# Patient Record
Sex: Female | Born: 1980 | Race: White | Hispanic: No | Marital: Married | Smoking: Never smoker
Health system: Southern US, Community
[De-identification: ages and names within clinical notes are randomized; demographics above are authoritative.]

## PROBLEM LIST (undated history)

## (undated) ENCOUNTER — Inpatient Hospital Stay (HOSPITAL_COMMUNITY): Payer: Self-pay

## (undated) DIAGNOSIS — F988 Other specified behavioral and emotional disorders with onset usually occurring in childhood and adolescence: Secondary | ICD-10-CM

## (undated) DIAGNOSIS — Z789 Other specified health status: Secondary | ICD-10-CM

## (undated) HISTORY — DX: Other specified behavioral and emotional disorders with onset usually occurring in childhood and adolescence: F98.8

## (undated) HISTORY — PX: OTHER SURGICAL HISTORY: SHX169

---

## 2001-04-22 ENCOUNTER — Other Ambulatory Visit: Admission: RE | Admit: 2001-04-22 | Discharge: 2001-04-22 | Payer: Self-pay | Admitting: *Deleted

## 2002-06-10 ENCOUNTER — Other Ambulatory Visit: Admission: RE | Admit: 2002-06-10 | Discharge: 2002-06-10 | Payer: Self-pay | Admitting: Obstetrics and Gynecology

## 2003-06-15 ENCOUNTER — Other Ambulatory Visit: Admission: RE | Admit: 2003-06-15 | Discharge: 2003-06-15 | Payer: Self-pay | Admitting: Obstetrics and Gynecology

## 2005-04-12 ENCOUNTER — Other Ambulatory Visit: Admission: RE | Admit: 2005-04-12 | Discharge: 2005-04-12 | Payer: Self-pay | Admitting: Obstetrics and Gynecology

## 2005-09-24 ENCOUNTER — Ambulatory Visit: Payer: Self-pay | Admitting: Internal Medicine

## 2006-04-19 ENCOUNTER — Other Ambulatory Visit: Admission: RE | Admit: 2006-04-19 | Discharge: 2006-04-19 | Payer: Self-pay | Admitting: Obstetrics & Gynecology

## 2006-04-19 ENCOUNTER — Ambulatory Visit: Payer: Self-pay | Admitting: Internal Medicine

## 2006-11-29 ENCOUNTER — Ambulatory Visit: Payer: Self-pay | Admitting: Internal Medicine

## 2006-11-29 LAB — CONVERTED CEMR LAB
ALT: 19 units/L (ref 0–40)
AST: 24 units/L (ref 0–37)
Albumin: 4.2 g/dL (ref 3.5–5.2)
Basophils Absolute: 0.1 10*3/uL (ref 0.0–0.1)
Calcium: 9.2 mg/dL (ref 8.4–10.5)
Chloride: 104 meq/L (ref 96–112)
Creatinine, Ser: 0.5 mg/dL (ref 0.4–1.2)
Eosinophils Absolute: 0.1 10*3/uL (ref 0.0–0.6)
Eosinophils Relative: 1.3 % (ref 0.0–5.0)
GFR calc non Af Amer: 157 mL/min
Glucose, Bld: 122 mg/dL — ABNORMAL HIGH (ref 70–99)
Ketones, ur: NEGATIVE mg/dL
LDL Cholesterol: 119 mg/dL — ABNORMAL HIGH (ref 0–99)
MCV: 94.8 fL (ref 78.0–100.0)
Nitrite: NEGATIVE
Platelets: 253 10*3/uL (ref 150–400)
RBC: 4.19 M/uL (ref 3.87–5.11)
RDW: 12 % (ref 11.5–14.6)
Total CHOL/HDL Ratio: 3.3
Total Protein, Urine: NEGATIVE mg/dL
Urine Glucose: NEGATIVE mg/dL
Urobilinogen, UA: 0.2 (ref 0.0–1.0)
VLDL: 10 mg/dL (ref 0–40)
WBC: 9.4 10*3/uL (ref 4.5–10.5)
pH: 6.5 (ref 5.0–8.0)

## 2007-08-12 ENCOUNTER — Encounter: Payer: Self-pay | Admitting: Internal Medicine

## 2007-08-12 DIAGNOSIS — F988 Other specified behavioral and emotional disorders with onset usually occurring in childhood and adolescence: Secondary | ICD-10-CM

## 2007-08-12 HISTORY — DX: Other specified behavioral and emotional disorders with onset usually occurring in childhood and adolescence: F98.8

## 2007-11-03 ENCOUNTER — Telehealth (INDEPENDENT_AMBULATORY_CARE_PROVIDER_SITE_OTHER): Payer: Self-pay | Admitting: *Deleted

## 2007-12-04 ENCOUNTER — Telehealth (INDEPENDENT_AMBULATORY_CARE_PROVIDER_SITE_OTHER): Payer: Self-pay | Admitting: *Deleted

## 2007-12-10 ENCOUNTER — Other Ambulatory Visit: Admission: RE | Admit: 2007-12-10 | Discharge: 2007-12-10 | Payer: Self-pay | Admitting: Obstetrics and Gynecology

## 2008-01-08 ENCOUNTER — Telehealth (INDEPENDENT_AMBULATORY_CARE_PROVIDER_SITE_OTHER): Payer: Self-pay | Admitting: *Deleted

## 2008-02-16 ENCOUNTER — Encounter: Payer: Self-pay | Admitting: Internal Medicine

## 2008-02-23 ENCOUNTER — Encounter: Payer: Self-pay | Admitting: Internal Medicine

## 2008-03-04 ENCOUNTER — Ambulatory Visit: Payer: Self-pay | Admitting: Internal Medicine

## 2008-03-04 DIAGNOSIS — K219 Gastro-esophageal reflux disease without esophagitis: Secondary | ICD-10-CM

## 2008-03-04 DIAGNOSIS — E739 Lactose intolerance, unspecified: Secondary | ICD-10-CM

## 2008-04-06 ENCOUNTER — Telehealth (INDEPENDENT_AMBULATORY_CARE_PROVIDER_SITE_OTHER): Payer: Self-pay | Admitting: *Deleted

## 2008-07-09 ENCOUNTER — Telehealth (INDEPENDENT_AMBULATORY_CARE_PROVIDER_SITE_OTHER): Payer: Self-pay | Admitting: *Deleted

## 2008-09-21 ENCOUNTER — Telehealth (INDEPENDENT_AMBULATORY_CARE_PROVIDER_SITE_OTHER): Payer: Self-pay | Admitting: *Deleted

## 2009-01-19 ENCOUNTER — Telehealth (INDEPENDENT_AMBULATORY_CARE_PROVIDER_SITE_OTHER): Payer: Self-pay | Admitting: *Deleted

## 2009-04-21 ENCOUNTER — Telehealth: Payer: Self-pay | Admitting: Internal Medicine

## 2009-05-19 ENCOUNTER — Ambulatory Visit: Payer: Self-pay | Admitting: Internal Medicine

## 2009-06-09 ENCOUNTER — Telehealth: Payer: Self-pay | Admitting: Internal Medicine

## 2009-08-24 ENCOUNTER — Telehealth: Payer: Self-pay | Admitting: Internal Medicine

## 2009-11-22 ENCOUNTER — Telehealth: Payer: Self-pay | Admitting: Internal Medicine

## 2010-02-22 ENCOUNTER — Telehealth: Payer: Self-pay | Admitting: Internal Medicine

## 2010-05-23 ENCOUNTER — Telehealth: Payer: Self-pay | Admitting: Internal Medicine

## 2010-05-29 ENCOUNTER — Ambulatory Visit: Payer: Self-pay | Admitting: Internal Medicine

## 2010-08-23 ENCOUNTER — Telehealth: Payer: Self-pay | Admitting: Internal Medicine

## 2010-09-26 LAB — HEPATITIS B SURFACE ANTIGEN: Hepatitis B Surface Ag: NEGATIVE

## 2010-09-26 LAB — ABO/RH: RH Type: POSITIVE

## 2010-09-26 LAB — HIV ANTIBODY (ROUTINE TESTING W REFLEX): HIV: NONREACTIVE

## 2010-09-26 LAB — RPR: RPR: NONREACTIVE

## 2010-10-19 ENCOUNTER — Telehealth: Payer: Self-pay | Admitting: Internal Medicine

## 2010-10-29 NOTE — L&D Delivery Note (Signed)
Requested by Dr. Renaldo Fiddler to  attend this C-section for arrest of descent.  Born to a 29y/o Primigravida with PNC A-Ab- and negative screens.  AROM 12 hours PTD with light MSAF.  Labor complicated by arrest of descent and  C-section performed.  Infant handed to Neo crying.  Dried, bulb suctioned and kept warm.  APGAR 9 and 9.  M. Heiress Williamson, MD Neonatologist

## 2010-11-28 NOTE — Progress Notes (Signed)
Summary: Vyvanse  Phone Note Call from Patient   Caller: Patient [phone] Summary of Call: Pt called requesting 3 mth refills of Vyvanse Initial call taken by: Margaret Pyle, CMA,  May 23, 2010 4:54 PM  Follow-up for Phone Call        no - was asked to make ROV at last refill Follow-up by: Corwin Levins MD,  May 23, 2010 5:05 PM  Additional Follow-up for Phone Call Additional follow up Details #1::        Pt informed via VM. Told to call back to sch appt. Additional Follow-up by: Margaret Pyle, CMA,  May 23, 2010 5:07 PM

## 2010-11-28 NOTE — Assessment & Plan Note (Signed)
Summary: f/u for medicine/o.v. not cpx per pt/cd   Vital Signs:  Patient profile:   30 year old female Height:      66.5 inches Weight:      124.75 pounds BMI:     19.91 O2 Sat:      95 % on Room air Temp:     98.3 degrees F oral Pulse rate:   70 / minute BP sitting:   106 / 68  (left arm) Cuff size:   regular  Vitals Entered By: Zella Ball Ewing CMA Duncan Dull) (May 29, 2010 11:31 AM)  O2 Flow:  Room air CC: Medication refill/RE   CC:  Medication refill/RE.  History of Present Illness: overall doing well,  good compliance and tolerance of med;  no overt signs of too high dosing such as wt loss or other.  No assoc psych symptoms such as increased panic or depressive symptom.  Functioning very well with her new positoin where she can work some from home, but also travels to 3 other cities in her work for a Corporate investment banker (Recruitment consultant).  No other complaints.  No fever, wt loss, night sweats, loss of appetite or other constitutional symptoms  Does plan on trying to become pregnant in the next yr and she know to stop the med when starting to try, or o/w becomes pregnant.   Problems Prior to Update: 1)  Gerd  (ICD-530.81) 2)  Glucose Intolerance  (ICD-271.3) 3)  Preventive Health Care  (ICD-V70.0) 4)  Add  (ICD-314.00)  Medications Prior to Update: 1)  Vyvanse 50 Mg  Caps (Lisdexamfetamine Dimesylate) .Marland Kitchen.. 1 By Mouth Once Daily - To Fill April 23, 2010   -    Please Make Return Office Visit For Further Refills After This  Current Medications (verified): 1)  Vyvanse 50 Mg  Caps (Lisdexamfetamine Dimesylate) .Marland Kitchen.. 1 By Mouth Once Daily - To Fill Sept 30, 2011  Allergies (verified): No Known Drug Allergies  Past History:  Past Medical History: Last updated: 03/04/2008 ADD GERD/regurgitation  Past Surgical History: Last updated: 03/04/2008 s/p IUD  Social History: Last updated: 05/29/2010 work - Occupational hygienist, Automotive engineer, also works for [employer]  -  Recruitment consultant Never Smoked Alcohol use-yes no children Married  Risk Factors: Smoking Status: never (03/04/2008)  Social History: Reviewed history from 03/04/2008 and no changes required. 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   Head:  normocephalic and atraumatic.   Eyes:  vision grossly intact, pupils equal, and pupils round.   Ears:  R ear normal and L ear normal.   Nose:  no external deformity and no nasal discharge.   Mouth:  no gingival abnormalities and pharynx pink and moist.   Neck:  supple and no masses.   Lungs:  normal respiratory effort and normal breath sounds.   Heart:  normal rate and regular rhythm.   Extremities:  no edema, no erythema  Neurologic:  alert & oriented X3, strength normal in all extremities, and gait normal.   Skin:  color normal and no rashes.   Psych:  good eye contact, not anxious appearing, and not depressed appearing.     Impression & Recommendations:  Problem # 1:  ADD (ICD-314.00) stable - to cont same meds -  refills done today  Complete Medication List: 1)  Vyvanse 50 Mg Caps (Lisdexamfetamine dimesylate) .Marland Kitchen.. 1 by mouth once daily - to fill sept 30, 2011  Patient Instructions: 1)  Continue all previous medications as before this visit  2)  Please schedule a follow-up appointment in 1 year or sooner if needed Prescriptions: VYVANSE 50 MG  CAPS (LISDEXAMFETAMINE DIMESYLATE) 1 by mouth once daily - to fill sept 30, 2011  #30 x 0   Entered and Authorized by:   Corwin Levins MD   Signed by:   Corwin Levins MD on 05/29/2010   Method used:   Print then Give to Patient   RxID:   [id number] VYVANSE 50 MG  CAPS (LISDEXAMFETAMINE DIMESYLATE) 1 by mouth once daily - to fill Jun 28, 2010  #30 x 0   Entered and  Authorized by:   Corwin Levins MD   Signed by:   Corwin Levins MD on 05/29/2010   Method used:   Print then Give to Patient   RxID:   [id number] VYVANSE 50 MG  CAPS (LISDEXAMFETAMINE DIMESYLATE) 1 by mouth once daily - to fill May 29, 2010  #30 x 0   Entered and Authorized by:   Corwin Levins MD   Signed by:   Corwin Levins MD on 05/29/2010   Method used:   Print then Give to Patient   RxID:   [id number]

## 2010-11-28 NOTE — Progress Notes (Signed)
Summary: Vyvanse  Phone Note Call from Patient   Caller: Patient [phone] Summary of Call: pt called requesting refill of Vyvanse Initial call taken by: Margaret Pyle, CMA,  November 22, 2009 9:09 AM  Follow-up for Phone Call        done hardcopy to LIM side B - dahlia  - 3 mmo Follow-up by: Corwin Levins MD,  November 22, 2009 12:37 PM  Additional Follow-up for Phone Call Additional follow up Details #1::        pt informed, rx in cabinet for pt pick up Additional Follow-up by: Margaret Pyle, CMA,  November 22, 2009 12:46 PM    New/Updated Medications: VYVANSE 50 MG  CAPS (LISDEXAMFETAMINE DIMESYLATE) 1 by mouth once daily - to fill Nov 23, 2009 VYVANSE 50 MG  CAPS (LISDEXAMFETAMINE DIMESYLATE) 1 by mouth once daily - to fill Dec 23, 2009 VYVANSE 50 MG  CAPS (LISDEXAMFETAMINE DIMESYLATE) 1 by mouth once daily - to fill Jan 22, 2010 Prescriptions: VYVANSE 50 MG  CAPS (LISDEXAMFETAMINE DIMESYLATE) 1 by mouth once daily - to fill Jan 22, 2010  #30 x 0   Entered and Authorized by:   Corwin Levins MD   Signed by:   Corwin Levins MD on 11/22/2009   Method used:   Print then Give to Patient   RxID:   [id number] VYVANSE 50 MG  CAPS (LISDEXAMFETAMINE DIMESYLATE) 1 by mouth once daily - to fill Dec 23, 2009  #30 x 0   Entered and Authorized by:   Corwin Levins MD   Signed by:   Corwin Levins MD on 11/22/2009   Method used:   Print then Give to Patient   RxID:   [id number] VYVANSE 50 MG  CAPS (LISDEXAMFETAMINE DIMESYLATE) 1 by mouth once daily - to fill Nov 23, 2009  #30 x 0   Entered and Authorized by:   Corwin Levins MD   Signed by:   Corwin Levins MD on 11/22/2009   Method used:   Print then Give to Patient   RxID:   [id number]   Appended Document: Vyvanse Spoke with pharmacist from [facility] in [place] (Amy [phone]) who called to verify Rx for Vyvanse and last OV.

## 2010-11-28 NOTE — Progress Notes (Signed)
Summary: Vyvanse  Phone Note Call from Patient   Caller: Patient [phone] Summary of Call: Pt called requesting 3 mth refill of Vyvanse Initial call taken by: Margaret Pyle, CMA,  August 23, 2010 10:48 AM  Follow-up for Phone Call        done hardcopy to LIM side B - dahlia  Follow-up by: Corwin Levins MD,  August 23, 2010 6:35 PM  Additional Follow-up for Phone Call Additional follow up Details #1::        pt informed via VM, Rx in cabinet for pt pick up Additional Follow-up by: Margaret Pyle, CMA,  August 24, 2010 8:21 AM    New/Updated Medications: VYVANSE 50 MG  CAPS (LISDEXAMFETAMINE DIMESYLATE) 1 by mouth once daily - to fill Aug 27, 2010 VYVANSE 50 MG  CAPS (LISDEXAMFETAMINE DIMESYLATE) 1 by mouth once daily - to fill Sep 26, 2010 VYVANSE 50 MG  CAPS (LISDEXAMFETAMINE DIMESYLATE) 1 by mouth once daily - to fill Oct 26, 2010 Prescriptions: VYVANSE 50 MG  CAPS (LISDEXAMFETAMINE DIMESYLATE) 1 by mouth once daily - to fill Oct 26, 2010  #30 x 0   Entered and Authorized by:   Corwin Levins MD   Signed by:   Corwin Levins MD on 08/23/2010   Method used:   Print then Give to Patient   RxID:   [id number] VYVANSE 50 MG  CAPS (LISDEXAMFETAMINE DIMESYLATE) 1 by mouth once daily - to fill Sep 26, 2010  #30 x 0   Entered and Authorized by:   Corwin Levins MD   Signed by:   Corwin Levins MD on 08/23/2010   Method used:   Print then Give to Patient   RxID:   [id number] VYVANSE 50 MG  CAPS (LISDEXAMFETAMINE DIMESYLATE) 1 by mouth once daily - to fill Aug 27, 2010  #30 x 0   Entered and Authorized by:   Corwin Levins MD   Signed by:   Corwin Levins MD on 08/23/2010   Method used:   Print then Give to Patient   RxID:   [id number]

## 2010-11-28 NOTE — Progress Notes (Signed)
Summary: Sydney Stewart  Phone Note Call from Patient   Caller: Patient [phone] Summary of Call: Pt called requesting Rx for Sydney Stewart Initial call taken by: Margaret Pyle, CMA,  February 22, 2010 11:13 AM  Follow-up for Phone Call        done hardcopy to LIM side B - dahlia  Follow-up by: Corwin Levins MD,  February 22, 2010 11:35 AM  Additional Follow-up for Phone Call Additional follow up Details #1::        pt informed via VM, Rx in cabinet for pick up Additional Follow-up by: Margaret Pyle, CMA,  February 22, 2010 11:39 AM    New/Updated Medications: Sydney Stewart 50 MG  CAPS (LISDEXAMFETAMINE DIMESYLATE) 1 by mouth once daily - to fill Feb 22, 2010 Sydney Stewart 50 MG  CAPS (LISDEXAMFETAMINE DIMESYLATE) 1 by mouth once daily - to fill Mar 24, 2010 Sydney Stewart 50 MG  CAPS (LISDEXAMFETAMINE DIMESYLATE) 1 by mouth once daily - to fill April 23, 2010 Sydney Stewart 50 MG  CAPS (LISDEXAMFETAMINE DIMESYLATE) 1 by mouth once daily - to fill April 23, 2010   -    please make return office visit for further refills after this Prescriptions: Sydney Stewart 50 MG  CAPS (LISDEXAMFETAMINE DIMESYLATE) 1 by mouth once daily - to fill April 23, 2010   -    please make return office visit for further refills after this  #30 x 0   Entered and Authorized by:   Corwin Levins MD   Signed by:   Corwin Levins MD on 02/22/2010   Method used:   Print then Give to Patient   RxID:   [id number] Sydney Stewart 50 MG  CAPS (LISDEXAMFETAMINE DIMESYLATE) 1 by mouth once daily - to fill April 23, 2010  #30 x 0   Entered and Authorized by:   Corwin Levins MD   Signed by:   Corwin Levins MD on 02/22/2010   Method used:   Print then Give to Patient   RxID:   [id number] Sydney Stewart 50 MG  CAPS (LISDEXAMFETAMINE DIMESYLATE) 1 by mouth once daily - to fill Mar 24, 2010  #30 x 0   Entered and Authorized by:   Corwin Levins MD   Signed by:   Corwin Levins MD on 02/22/2010   Method used:   Print then Give to Patient   RxID:    [id number] Sydney Stewart 50 MG  CAPS (LISDEXAMFETAMINE DIMESYLATE) 1 by mouth once daily - to fill Feb 22, 2010  #30 x 0   Entered and Authorized by:   Corwin Levins MD   Signed by:   Corwin Levins MD on 02/22/2010   Method used:   Print then Give to Patient   RxID:   [id number]

## 2010-11-30 NOTE — Progress Notes (Signed)
Summary: Vyvanse  Phone Note Call from Patient   Caller: Patient Summary of Call: Patient came in to pickup Vyvanse prescriptions written on 08/23/2010. Patient was called to pickup prescriptions, but never came by to pick them up. The cabnet was cleaned out of presciptions not picked up and had been there over a month.. Patient is requesting the refill on her Vyvanse. Initial call taken by: [name] Ewing CMA Duncan Dull),  October 19, 2010 11:03 AM  Follow-up for Phone Call        done hardcopy to LIM side B - dahlia \\par  Follow-up by: Corwin Levins MD,  October 19, 2010 11:12 AM  Additional Follow-up for Phone Call Additional follow up Details #1::        no answer no VM. Margaret Pyle, CMA  October 19, 2010 1:20 PM     Additional Follow-up for Phone Call Additional follow up Details #2::    called patient at [phone] left message that prescriptions requested are at the front desk and ready for pickup. Follow-up by: Zella Ball Ewing CMA Duncan Dull),  October 19, 2010 3:35 PM  New/Updated Medications: VYVANSE 50 MG  CAPS (LISDEXAMFETAMINE DIMESYLATE) 1 by mouth once daily - to fill Oct 19, 2010 VYVANSE 50 MG  CAPS (LISDEXAMFETAMINE DIMESYLATE) 1 by mouth once daily - to fill Nov 18, 2010 VYVANSE 50 MG  CAPS (LISDEXAMFETAMINE DIMESYLATE) 1 by mouth once daily - to fill Dec 18, 2010 Prescriptions: VYVANSE 50 MG  CAPS (LISDEXAMFETAMINE DIMESYLATE) 1 by mouth once daily - to fill Dec 18, 2010  #30 x 0   Entered and Authorized by:   Corwin Levins MD   Signed by:   Corwin Levins MD on 10/19/2010   Method used:   Print then Give to Patient   RxID:   [id number] VYVANSE 50 MG  CAPS (LISDEXAMFETAMINE DIMESYLATE) 1 by mouth once daily - to fill Nov 18, 2010  #30 x 0   Entered and Authorized by:   Corwin Levins MD   Signed by:   Corwin Levins MD on 10/19/2010   Method used:   Print then Give to Patient   RxID:   [id number] VYVANSE 50 MG  CAPS (LISDEXAMFETAMINE DIMESYLATE) 1 by mouth once  daily - to fill Oct 19, 2010  #30 x 0   Entered and Authorized by:   Corwin Levins MD   Signed by:   Corwin Levins MD on 10/19/2010   Method used:   Print then Give to Patient   RxID:   [id number]

## 2011-03-23 ENCOUNTER — Ambulatory Visit: Payer: Self-pay | Admitting: Internal Medicine

## 2011-05-07 ENCOUNTER — Inpatient Hospital Stay (HOSPITAL_COMMUNITY)
Admission: AD | Admit: 2011-05-07 | Discharge: 2011-05-07 | Payer: Managed Care, Other (non HMO) | Source: Ambulatory Visit | Attending: Obstetrics and Gynecology | Admitting: Obstetrics and Gynecology

## 2011-05-07 ENCOUNTER — Inpatient Hospital Stay (HOSPITAL_COMMUNITY): Admission: AD | Admit: 2011-05-07 | Payer: Self-pay | Source: Home / Self Care | Admitting: Obstetrics and Gynecology

## 2011-05-07 ENCOUNTER — Encounter (HOSPITAL_COMMUNITY): Payer: Self-pay | Admitting: *Deleted

## 2011-05-07 ENCOUNTER — Inpatient Hospital Stay (HOSPITAL_COMMUNITY)
Admission: AD | Admit: 2011-05-07 | Discharge: 2011-05-11 | DRG: 766 | Disposition: A | Discharge status: Home / Self Care | Payer: Managed Care, Other (non HMO) | Source: Ambulatory Visit | Attending: Obstetrics and Gynecology | Admitting: Obstetrics and Gynecology

## 2011-05-07 LAB — CBC
MCHC: 35 g/dL (ref 30.0–36.0)
Platelets: 168 10*3/uL (ref 150–400)
RDW: 12.5 % (ref 11.5–15.5)

## 2011-05-07 MED ORDER — ACETAMINOPHEN 325 MG PO TABS: 650.0000 mg | ORAL_TABLET | ORAL | Status: DC | PRN

## 2011-05-07 MED ORDER — SODIUM CHLORIDE 0.9 % IV SOLN: 250.0000 mL | INTRAVENOUS | Status: DC

## 2011-05-07 MED ORDER — ZOLPIDEM TARTRATE 10 MG PO TABS: 10.0000 mg | ORAL_TABLET | Freq: Every evening | ORAL | Status: DC | PRN

## 2011-05-07 MED ORDER — LACTATED RINGERS IV SOLN
INTRAVENOUS | Status: DC
  Administered 2011-05-08: 500 mL/h via INTRAVENOUS
  Administered 2011-05-08: 21:00:00 via INTRAVENOUS
  Administered 2011-05-08: 125 mL via INTRAVENOUS
  Administered 2011-05-08: 125 mL/h via INTRAVENOUS

## 2011-05-07 MED ORDER — IBUPROFEN 600 MG PO TABS: 600.0000 mg | ORAL_TABLET | Freq: Four times a day (QID) | ORAL | Status: DC | PRN

## 2011-05-07 MED ORDER — ONDANSETRON HCL 4 MG/2ML IJ SOLN: 4.0000 mg | Freq: Four times a day (QID) | INTRAMUSCULAR | Status: DC | PRN

## 2011-05-07 MED ORDER — OXYTOCIN 20 UNITS IN LACTATED RINGERS INFUSION - SIMPLE
125.0000 mL/h | Freq: Once | INTRAVENOUS | Status: DC
  Filled 2011-05-07: qty 1000

## 2011-05-07 MED ORDER — SODIUM CHLORIDE 0.9 % IJ SOLN: 3.0000 mL | Freq: Two times a day (BID) | INTRAMUSCULAR | Status: DC

## 2011-05-07 MED ORDER — MISOPROSTOL 25 MCG QUARTER TABLET
25.0000 ug | ORAL_TABLET | ORAL | Status: DC
  Administered 2011-05-07: 25 ug via VAGINAL
  Filled 2011-05-07 (×4): qty 1
  Filled 2011-05-07: qty 0.25

## 2011-05-07 MED ORDER — LACTATED RINGERS IV SOLN: 500.0000 mL | Freq: Once | INTRAVENOUS | Status: AC | PRN

## 2011-05-07 MED ORDER — FLEET ENEMA 7-19 GM/118ML RE ENEM: 1.0000 | ENEMA | RECTAL | Status: DC | PRN

## 2011-05-07 MED ORDER — LIDOCAINE HCL (PF) 1 % IJ SOLN: 30.0000 mL | Freq: Once | INTRAMUSCULAR | Status: AC | PRN

## 2011-05-07 MED ORDER — CITRIC ACID-SODIUM CITRATE 334-500 MG/5ML PO SOLN
30.0000 mL | ORAL | Status: DC | PRN
  Administered 2011-05-08: 30 mL via ORAL
  Filled 2011-05-07: qty 30

## 2011-05-07 MED ORDER — SODIUM CHLORIDE 0.9 % IJ SOLN: 3.0000 mL | INTRAMUSCULAR | Status: DC | PRN

## 2011-05-07 NOTE — Plan of Care (Signed)
Problem: Consults Goal: Birthing Suites Patient Information Press F2 to bring up selections list  Outcome: Completed/Met Date Met:  05/07/11  Pt > [redacted] weeks EGA and Inpatient induction

## 2011-05-08 ENCOUNTER — Encounter (HOSPITAL_COMMUNITY): Payer: Self-pay

## 2011-05-08 ENCOUNTER — Encounter (HOSPITAL_COMMUNITY)
Admission: AD | Disposition: A | Discharge status: Home / Self Care | Payer: Self-pay | Source: Ambulatory Visit | Attending: Obstetrics and Gynecology

## 2011-05-08 ENCOUNTER — Encounter (HOSPITAL_COMMUNITY): Payer: Self-pay | Admitting: Anesthesiology

## 2011-05-08 ENCOUNTER — Inpatient Hospital Stay (HOSPITAL_COMMUNITY): Payer: Managed Care, Other (non HMO) | Admitting: Anesthesiology

## 2011-05-08 ENCOUNTER — Other Ambulatory Visit: Payer: Self-pay | Admitting: Obstetrics and Gynecology

## 2011-05-08 LAB — RPR: RPR Ser Ql: NONREACTIVE

## 2011-05-08 SURGERY — Surgical Case
Anesthesia: Regional

## 2011-05-08 SURGERY — Surgical Case
Anesthesia: Epidural | Site: Abdomen | Wound class: Clean Contaminated

## 2011-05-08 MED ORDER — METOCLOPRAMIDE HCL 5 MG/ML IJ SOLN: 10.0000 mg | Freq: Three times a day (TID) | INTRAMUSCULAR | Status: DC | PRN

## 2011-05-08 MED ORDER — SODIUM BICARBONATE 8.4 % IV SOLN
INTRAVENOUS | Status: AC
  Filled 2011-05-08: qty 50

## 2011-05-08 MED ORDER — SODIUM CHLORIDE 0.9 % IV SOLN: 1.0000 ug/kg/h | INTRAVENOUS | Status: DC | PRN

## 2011-05-08 MED ORDER — OXYTOCIN 10 UNIT/ML IJ SOLN
INTRAMUSCULAR | Status: AC
  Administered 2011-05-08: 20 [IU] via INTRAVENOUS
  Filled 2011-05-08: qty 2

## 2011-05-08 MED ORDER — PRENATAL PLUS 27-1 MG PO TABS
1.0000 | ORAL_TABLET | Freq: Every day | ORAL | Status: DC
  Administered 2011-05-10: 1 via ORAL
  Filled 2011-05-08 (×3): qty 1

## 2011-05-08 MED ORDER — EPHEDRINE 5 MG/ML INJ
10.0000 mg | INTRAVENOUS | Status: DC | PRN
  Filled 2011-05-08 (×2): qty 4

## 2011-05-08 MED ORDER — PHENYLEPHRINE HCL 10 MG/ML IJ SOLN
INTRAMUSCULAR | Status: DC | PRN
  Administered 2011-05-08: 160 ug via INTRAVENOUS

## 2011-05-08 MED ORDER — LIDOCAINE-EPINEPHRINE (PF) 2 %-1:200000 IJ SOLN
INTRAMUSCULAR | Status: DC | PRN
  Administered 2011-05-08: 40 mg via INTRADERMAL
  Administered 2011-05-08: 60 mg via INTRADERMAL

## 2011-05-08 MED ORDER — EPHEDRINE SULFATE 50 MG/ML IJ SOLN
INTRAMUSCULAR | Status: DC | PRN
  Administered 2011-05-08: 10 mg via INTRAVENOUS

## 2011-05-08 MED ORDER — EPHEDRINE 5 MG/ML INJ
INTRAVENOUS | Status: AC
  Filled 2011-05-08: qty 10

## 2011-05-08 MED ORDER — SENNOSIDES-DOCUSATE SODIUM 8.6-50 MG PO TABS
1.0000 | ORAL_TABLET | Freq: Every day | ORAL | Status: DC
  Administered 2011-05-09: 1 via ORAL

## 2011-05-08 MED ORDER — MEPERIDINE HCL 25 MG/ML IJ SOLN
INTRAMUSCULAR | Status: AC
  Filled 2011-05-08: qty 1

## 2011-05-08 MED ORDER — SCOPOLAMINE 1 MG/3DAYS TD PT72
MEDICATED_PATCH | TRANSDERMAL | Status: AC
  Administered 2011-05-08: 1.5 mg via TRANSDERMAL
  Filled 2011-05-08: qty 1

## 2011-05-08 MED ORDER — OXYTOCIN 20 UNITS IN LACTATED RINGERS INFUSION - SIMPLE: 125.0000 mL/h | INTRAVENOUS | Status: AC

## 2011-05-08 MED ORDER — ONDANSETRON HCL 4 MG/2ML IJ SOLN: 4.0000 mg | Freq: Three times a day (TID) | INTRAMUSCULAR | Status: DC | PRN

## 2011-05-08 MED ORDER — DEXTROSE IN LACTATED RINGERS 5 % IV SOLN: INTRAVENOUS | Status: DC

## 2011-05-08 MED ORDER — SIMETHICONE 80 MG PO CHEW
80.0000 mg | CHEWABLE_TABLET | ORAL | Status: DC | PRN
  Administered 2011-05-09: 80 mg via ORAL

## 2011-05-08 MED ORDER — OXYTOCIN 10 UNIT/ML IJ SOLN
INTRAMUSCULAR | Status: AC
  Filled 2011-05-08: qty 2

## 2011-05-08 MED ORDER — LISDEXAMFETAMINE DIMESYLATE 50 MG PO CAPS: 50.0000 mg | ORAL_CAPSULE | Freq: Every day | ORAL | Status: DC

## 2011-05-08 MED ORDER — LACTATED RINGERS IV SOLN: 500.0000 mL | Freq: Once | INTRAVENOUS | Status: DC

## 2011-05-08 MED ORDER — KETOROLAC TROMETHAMINE 60 MG/2ML IM SOLN
60.0000 mg | Freq: Once | INTRAMUSCULAR | Status: AC | PRN
  Filled 2011-05-08: qty 2

## 2011-05-08 MED ORDER — KETOROLAC TROMETHAMINE 30 MG/ML IJ SOLN: 30.0000 mg | Freq: Four times a day (QID) | INTRAMUSCULAR | Status: AC | PRN

## 2011-05-08 MED ORDER — ONDANSETRON HCL 4 MG/2ML IJ SOLN
INTRAMUSCULAR | Status: AC
  Filled 2011-05-08: qty 2

## 2011-05-08 MED ORDER — CEFAZOLIN SODIUM 1-5 GM-% IV SOLN
1.0000 g | Freq: Once | INTRAVENOUS | Status: AC
  Administered 2011-05-08: 1 g via INTRAVENOUS
  Filled 2011-05-08: qty 50

## 2011-05-08 MED ORDER — EPHEDRINE 5 MG/ML INJ
10.0000 mg | INTRAVENOUS | Status: DC | PRN
  Filled 2011-05-08: qty 4

## 2011-05-08 MED ORDER — PHENYLEPHRINE 40 MCG/ML (10ML) SYRINGE FOR IV PUSH (FOR BLOOD PRESSURE SUPPORT)
PREFILLED_SYRINGE | INTRAVENOUS | Status: AC
  Filled 2011-05-08: qty 5

## 2011-05-08 MED ORDER — SIMETHICONE 80 MG PO CHEW
80.0000 mg | CHEWABLE_TABLET | Freq: Three times a day (TID) | ORAL | Status: DC
  Administered 2011-05-09 – 2011-05-10 (×5): 80 mg via ORAL

## 2011-05-08 MED ORDER — OXYCODONE-ACETAMINOPHEN 5-325 MG PO TABS: 1.0000 | ORAL_TABLET | ORAL | Status: DC | PRN

## 2011-05-08 MED ORDER — MEASLES, MUMPS & RUBELLA VAC ~~LOC~~ INJ
0.5000 mL | INJECTION | Freq: Once | SUBCUTANEOUS | Status: DC
  Filled 2011-05-08: qty 0.5

## 2011-05-08 MED ORDER — DIPHENHYDRAMINE HCL 50 MG/ML IJ SOLN: 12.5000 mg | INTRAMUSCULAR | Status: DC | PRN

## 2011-05-08 MED ORDER — IBUPROFEN 600 MG PO TABS
600.0000 mg | ORAL_TABLET | Freq: Four times a day (QID) | ORAL | Status: DC
  Administered 2011-05-09 – 2011-05-11 (×8): 600 mg via ORAL
  Filled 2011-05-08 (×8): qty 1

## 2011-05-08 MED ORDER — MORPHINE SULFATE (PF) 0.5 MG/ML IJ SOLN
INTRAMUSCULAR | Status: DC | PRN
  Administered 2011-05-08: 4 mg via EPIDURAL

## 2011-05-08 MED ORDER — MEDROXYPROGESTERONE ACETATE 150 MG/ML IM SUSP: 150.0000 mg | INTRAMUSCULAR | Status: DC | PRN

## 2011-05-08 MED ORDER — OXYTOCIN 20 UNITS IN LACTATED RINGERS INFUSION - SIMPLE
1.0000 m[IU]/min | INTRAVENOUS | Status: DC
  Administered 2011-05-08: 2 m[IU]/min via INTRAVENOUS

## 2011-05-08 MED ORDER — FENTANYL 2.5 MCG/ML BUPIVACAINE 1/10 % EPIDURAL INFUSION (WH - ANES)
2.0000 mL/h | INTRAMUSCULAR | Status: DC
  Administered 2011-05-08 (×4): 14 mL/h via EPIDURAL
  Filled 2011-05-08 (×4): qty 60

## 2011-05-08 MED ORDER — ONDANSETRON HCL 4 MG/2ML IJ SOLN
INTRAMUSCULAR | Status: DC | PRN
  Administered 2011-05-08: 4 mg via INTRAMUSCULAR

## 2011-05-08 MED ORDER — DIPHENHYDRAMINE HCL 50 MG/ML IJ SOLN: 25.0000 mg | INTRAMUSCULAR | Status: DC | PRN

## 2011-05-08 MED ORDER — DIPHENHYDRAMINE HCL 25 MG PO CAPS: 25.0000 mg | ORAL_CAPSULE | Freq: Four times a day (QID) | ORAL | Status: DC | PRN

## 2011-05-08 MED ORDER — PHENYLEPHRINE 40 MCG/ML (10ML) SYRINGE FOR IV PUSH (FOR BLOOD PRESSURE SUPPORT)
80.0000 ug | PREFILLED_SYRINGE | INTRAVENOUS | Status: DC | PRN
  Filled 2011-05-08 (×2): qty 5

## 2011-05-08 MED ORDER — SCOPOLAMINE 1 MG/3DAYS TD PT72
1.0000 | MEDICATED_PATCH | Freq: Once | TRANSDERMAL | Status: DC
  Administered 2011-05-08: 1.5 mg via TRANSDERMAL

## 2011-05-08 MED ORDER — NALOXONE HCL 0.4 MG/ML IJ SOLN: 0.4000 mg | INTRAMUSCULAR | Status: DC | PRN

## 2011-05-08 MED ORDER — NALBUPHINE HCL 10 MG/ML IJ SOLN: 5.0000 mg | INTRAMUSCULAR | Status: AC | PRN

## 2011-05-08 MED ORDER — WITCH HAZEL-GLYCERIN EX PADS: MEDICATED_PAD | CUTANEOUS | Status: DC | PRN

## 2011-05-08 MED ORDER — PHENYLEPHRINE 40 MCG/ML (10ML) SYRINGE FOR IV PUSH (FOR BLOOD PRESSURE SUPPORT)
80.0000 ug | PREFILLED_SYRINGE | INTRAVENOUS | Status: DC | PRN
  Filled 2011-05-08: qty 5

## 2011-05-08 MED ORDER — RHO D IMMUNE GLOBULIN 1500 UNIT/2ML IJ SOLN: 300.0000 ug | Freq: Once | INTRAMUSCULAR | Status: DC

## 2011-05-08 MED ORDER — NALBUPHINE SYRINGE 5 MG/0.5 ML
INJECTION | INTRAMUSCULAR | Status: AC
  Filled 2011-05-08: qty 1

## 2011-05-08 MED ORDER — DIPHENHYDRAMINE HCL 25 MG PO CAPS: 25.0000 mg | ORAL_CAPSULE | ORAL | Status: DC | PRN

## 2011-05-08 MED ORDER — KETOROLAC TROMETHAMINE 60 MG/2ML IM SOLN
INTRAMUSCULAR | Status: AC
  Administered 2011-05-08: 60 mg via INTRAMUSCULAR
  Filled 2011-05-08: qty 2

## 2011-05-08 MED ORDER — OXYTOCIN 20 UNITS IN LACTATED RINGERS INFUSION - SIMPLE
INTRAVENOUS | Status: DC | PRN
  Administered 2011-05-08: 1000 mL/h via INTRAVENOUS

## 2011-05-08 MED ORDER — NALBUPHINE HCL 10 MG/ML IJ SOLN
10.0000 mg | INTRAMUSCULAR | Status: DC
  Administered 2011-05-08: 10 mg via INTRAVENOUS
  Filled 2011-05-08 (×2): qty 1

## 2011-05-08 MED ORDER — SODIUM BICARBONATE 8.4 % IV SOLN
INTRAVENOUS | Status: DC | PRN
  Administered 2011-05-08: 5 mL via EPIDURAL

## 2011-05-08 MED ORDER — MORPHINE SULFATE 10 MG/ML IJ SOLN
INTRAMUSCULAR | Status: DC | PRN
  Administered 2011-05-08: 1 mg via INTRAVENOUS

## 2011-05-08 MED ORDER — LIDOCAINE-EPINEPHRINE (PF) 2 %-1:200000 IJ SOLN
INTRAMUSCULAR | Status: AC
  Filled 2011-05-08: qty 20

## 2011-05-08 MED ORDER — MENTHOL 3 MG MT LOZG: 1.0000 | LOZENGE | OROMUCOSAL | Status: DC | PRN

## 2011-05-08 MED ORDER — FENTANYL CITRATE 0.05 MG/ML IJ SOLN: 25.0000 ug | INTRAMUSCULAR | Status: DC | PRN

## 2011-05-08 MED ORDER — ONDANSETRON HCL 4 MG/2ML IJ SOLN: 4.0000 mg | Freq: Once | INTRAMUSCULAR | Status: AC | PRN

## 2011-05-08 MED ORDER — DEXAMETHASONE SODIUM PHOSPHATE 10 MG/ML IJ SOLN
INTRAMUSCULAR | Status: AC
  Filled 2011-05-08: qty 1

## 2011-05-08 MED ORDER — TETANUS-DIPHTH-ACELL PERTUSSIS 5-2.5-18.5 LF-MCG/0.5 IM SUSP
0.5000 mL | Freq: Once | INTRAMUSCULAR | Status: AC
  Administered 2011-05-09: 0.5 mL via INTRAMUSCULAR
  Filled 2011-05-08: qty 0.5

## 2011-05-08 MED ORDER — ONDANSETRON HCL 4 MG PO TABS: 4.0000 mg | ORAL_TABLET | ORAL | Status: DC | PRN

## 2011-05-08 MED ORDER — MORPHINE SULFATE 0.5 MG/ML IJ SOLN
INTRAMUSCULAR | Status: AC
  Filled 2011-05-08: qty 10

## 2011-05-08 MED ORDER — MEPERIDINE HCL 25 MG/ML IJ SOLN
INTRAMUSCULAR | Status: DC | PRN
  Administered 2011-05-08 (×2): 6 mg via INTRAVENOUS
  Administered 2011-05-08: 7 mg via INTRAVENOUS
  Administered 2011-05-08: 6 mg via INTRAVENOUS

## 2011-05-08 SURGICAL SUPPLY — 30 items
ADH SKN CLS APL DERMABOND .7 (GAUZE/BANDAGES/DRESSINGS) ×1
CHLORAPREP W/TINT 26ML (MISCELLANEOUS) ×3 IMPLANT
CLOTH BEACON ORANGE TIMEOUT ST (SAFETY) ×2 IMPLANT
DERMABOND ADVANCED (GAUZE/BANDAGES/DRESSINGS) ×1
DERMABOND ADVANCED .7 DNX12 (GAUZE/BANDAGES/DRESSINGS) IMPLANT
DRAPE UTILITY XL STRL (DRAPES) ×2 IMPLANT
DRSG COVADERM 4X10 (GAUZE/BANDAGES/DRESSINGS) ×1 IMPLANT
ELECT REM PT RETURN 9FT ADLT (ELECTROSURGICAL) ×2
ELECTRODE REM PT RTRN 9FT ADLT (ELECTROSURGICAL) ×1 IMPLANT
EXTRACTOR VACUUM M CUP 4 TUBE (SUCTIONS) IMPLANT
GLOVE BIO SURGEON STRL SZ 6.5 (GLOVE) ×2 IMPLANT
GLOVE BIOGEL PI IND STRL 7.0 (GLOVE) ×2 IMPLANT
GLOVE BIOGEL PI INDICATOR 7.0 (GLOVE) ×2
GOWN BRE IMP SLV AUR LG STRL (GOWN DISPOSABLE) ×6 IMPLANT
KIT ABG SYR 3ML LUER SLIP (SYRINGE) ×2 IMPLANT
NDL HYPO 25X5/8 SAFETYGLIDE (NEEDLE) ×1 IMPLANT
NEEDLE HYPO 25X5/8 SAFETYGLIDE (NEEDLE) ×2 IMPLANT
NS IRRIG 1000ML POUR BTL (IV SOLUTION) ×2 IMPLANT
PACK C SECTION WH (CUSTOM PROCEDURE TRAY) ×2 IMPLANT
SLEEVE SCD COMPRESS KNEE MED (MISCELLANEOUS) ×1 IMPLANT
STAPLER VISISTAT 35W (STAPLE) ×1 IMPLANT
SUT CHROMIC 0 CT 802H (SUTURE) IMPLANT
SUT CHROMIC 0 CTX 36 (SUTURE) ×7 IMPLANT
SUT MNCRL AB 3-0 PS2 27 (SUTURE) ×1 IMPLANT
SUT MON AB-0 CT1 36 (SUTURE) ×2 IMPLANT
SUT PDS AB 0 CTX 60 (SUTURE) ×2 IMPLANT
SUT PLAIN 0 NONE (SUTURE) IMPLANT
TOWEL OR 17X24 6PK STRL BLUE (TOWEL DISPOSABLE) ×4 IMPLANT
TRAY FOLEY CATH 14FR (SET/KITS/TRAYS/PACK) IMPLANT
WATER STERILE IRR 1000ML POUR (IV SOLUTION) ×2 IMPLANT

## 2011-05-08 SURGICAL SUPPLY — 26 items
CHLORAPREP W/TINT 26ML (MISCELLANEOUS) ×4 IMPLANT
CLOTH BEACON ORANGE TIMEOUT ST (SAFETY) ×2 IMPLANT
DRAPE UTILITY XL STRL (DRAPES) ×4 IMPLANT
ELECT REM PT RETURN 9FT ADLT (ELECTROSURGICAL) ×2
ELECTRODE REM PT RTRN 9FT ADLT (ELECTROSURGICAL) ×1 IMPLANT
EXTRACTOR VACUUM M CUP 4 TUBE (SUCTIONS) IMPLANT
GLOVE BIO SURGEON STRL SZ 6.5 (GLOVE) ×2 IMPLANT
GLOVE BIOGEL PI IND STRL 7.0 (GLOVE) ×2 IMPLANT
GLOVE BIOGEL PI INDICATOR 7.0 (GLOVE) ×2
GOWN BRE IMP SLV AUR LG STRL (GOWN DISPOSABLE) ×6 IMPLANT
KIT ABG SYR 3ML LUER SLIP (SYRINGE) ×2 IMPLANT
NDL HYPO 25X5/8 SAFETYGLIDE (NEEDLE) ×1 IMPLANT
NEEDLE HYPO 25X5/8 SAFETYGLIDE (NEEDLE) ×2 IMPLANT
NS IRRIG 1000ML POUR BTL (IV SOLUTION) ×2 IMPLANT
PACK C SECTION WH (CUSTOM PROCEDURE TRAY) ×2 IMPLANT
SLEEVE SCD COMPRESS KNEE MED (MISCELLANEOUS) IMPLANT
STAPLER VISISTAT 35W (STAPLE) IMPLANT
SUT CHROMIC 0 CT 802H (SUTURE) IMPLANT
SUT CHROMIC 0 CTX 36 (SUTURE) ×6 IMPLANT
SUT MNCRL AB 3-0 PS2 27 (SUTURE) IMPLANT
SUT MON AB-0 CT1 36 (SUTURE) ×2 IMPLANT
SUT PDS AB 0 CTX 60 (SUTURE) ×2 IMPLANT
SUT PLAIN 0 NONE (SUTURE) IMPLANT
TOWEL OR 17X24 6PK STRL BLUE (TOWEL DISPOSABLE) ×4 IMPLANT
TRAY FOLEY CATH 14FR (SET/KITS/TRAYS/PACK) IMPLANT
WATER STERILE IRR 1000ML POUR (IV SOLUTION) ×2 IMPLANT

## 2011-05-08 NOTE — Progress Notes (Signed)
  Comfortable w/ epidural FHT: reassuring w/ accels Toco: Q1-2 Cvx: 7-8cm by RN exam  A/P:  Continue exp mngt.

## 2011-05-08 NOTE — Progress Notes (Signed)
  Comfortable w/ epidural  FHT reassuring w/ accels Toco Q2  approx Cvx 9/C/0  Will start low dose pitocin, recheck cvx 1hr.

## 2011-05-08 NOTE — Anesthesia Postprocedure Evaluation (Deleted)
  Anesthesia Post-op Note  Patient: Sydney Stewart  Procedure(s) Performed:  CESAREAN SECTION  Patient is awake and responsive, and moving legs. Pain and nausea are reasonably well controlled. Vital signs are stable and clinically acceptable. Oxygen saturation is clinically acceptable. There are no apparent anesthetic complications at this time. Patient is ready for discharge.

## 2011-05-08 NOTE — Transfer of Care (Signed)
Immediate Anesthesia Transfer of Care Note  Patient: Sydney Stewart  Procedure(s) Performed:  CESAREAN SECTION - Primary cesarean section with delivery of baby boy at 2055. Apgars 9/9. Cord blood x2 sent to nursery. Uterus massaged by S. Satterfield RN  Patient Location: PACU  Anesthesia Type: Epidural  Level of Consciousness: awake, alert , oriented, sedated and patient cooperative  Airway & Oxygen Therapy: Patient Spontanous Breathing  Post-op Assessment: Report given to PACU RN and Post -op Vital signs reviewed and stable  Post vital signs: Reviewed and stable  Complications: No apparent anesthesia complications

## 2011-05-08 NOTE — Progress Notes (Signed)
  Pt comfortable w/ epidura,l FHT reassuring w/ accels Toco Q1-2 min Cvx: 9/C/0, caput  A/P:  Discussed primary c-section w/ pt and husband given arrest of descent.  R/B/A discussed and informed consent.

## 2011-05-08 NOTE — Op Note (Signed)
Cesarean Section Procedure Note   Sydney Stewart  05/08/2011  Indications: arrest of descent   Pre-operative Diagnosis: arrest of diltation.   Post-operative Diagnosis: Same   Surgeon: Surgeon(s) and Role:    Zelphia Cairo - Primary   Assistants: none  Anesthesia: epidural   Procedure Details:  The patient was seen in the Holding Room. The risks, benefits, complications, treatment options, and expected outcomes were discussed with the patient. The patient concurred with the proposed plan, giving informed consent. identified as [name] and the procedure verified as C-Section Delivery. A Time Out was held and the above information confirmed.  After induction of anesthesia, the patient was draped and prepped in the usual sterile manner. A transverse was made and carried down through the subcutaneous tissue to the fascia. Fascial incision was made and extended transversely. The fascia was separated from the underlying rectus tissue superiorly and inferiorly. The peritoneum was identified and entered. Peritoneal incision was extended longitudinally. The utero-vesical peritoneal reflection was incised transversely and the bladder flap was bluntly freed from the lower uterine segment. A low transverse uterine incision was made. Delivered from cephalic presentation was a female infant with Apgar scores of 9 at one minute and 9 at five minutes. Cord ph was not sent the umbilical cord was clamped and cut cord blood was obtained for evaluation. The placenta was removed Intact and appeared meconeum stained. The uterine outline, tubes and ovaries appeared normal}. The uterine incision was closed with running locked sutures of 0chromic gut.   Hemostasis was observed. Lavage was carried out until clear. Peritoneum was closed w/ 0 Monocril.  The fascia was then reapproximated with running sutures of 0 PDS.The skin was closed with 3-35monocril.   Instrument, sponge, and needle counts were correct prior  the abdominal closure and were correct at the conclusion of the case.    Findings: vigerous female infant w/ apgars 9,9.  Normal pelvic anatomy   Estimated Blood Loss: 700cc   Urine Output: 100CC OF tea colored urine  Specimens:  Specimens    placenta       Complications: no complications  Disposition: PACU - hemodynamically stable.   Maternal Condition: stable   Baby condition / location:  nursery-stable  Attending Attestation: I was present and scrubbed for the entire procedure.   Signed: Surgeon(s): Zelphia Cairo

## 2011-05-08 NOTE — Transfer of Care (Deleted)
Immediate Anesthesia Transfer of Care Note  Patient: Sydney Stewart  Procedure(s) Performed:  CESAREAN SECTION  Patient Location: PACU  Anesthesia Type: Spinal  Level of Consciousness: awake, alert  and oriented  Airway & Oxygen Therapy: Patient Spontanous Breathing  Post-op Assessment: Report given to PACU RN and Post -op Vital signs reviewed and stable  Post vital signs: Reviewed and stable  Complications: No apparent anesthesia complications

## 2011-05-08 NOTE — Anesthesia Preprocedure Evaluation (Deleted)
Anesthesia Evaluation Anesthesia Physical Anesthesia Plan Anesthesia Quick Evaluation  

## 2011-05-08 NOTE — Progress Notes (Signed)
  Pt comfortable w/ epidural FHT reassuring w/ accels Toco Q1-3 Cvx 8/C/0- -1  IUPC placed w/out resistance  A/P:  Continue exp mngt.  Will evaluate ctx strength and need for pitocin.

## 2011-05-08 NOTE — Anesthesia Preprocedure Evaluation (Addendum)
Anesthesia Evaluation  Name, MR# and DOB Patient awake  General Assessment Comment  Reviewed: Allergy & Precautions, H&P , Patient's Chart, lab work & pertinent test results and reviewed documented beta blocker date and time   Airway Mallampati: II TM Distance: >3 FB Neck ROM: full    Dental No notable dental hx (+) Teeth Intact   Pulmonaryneg pulmonary ROS    clear to auscultation  pulmonary exam normal Discussed epidural, and patient consents to the procedure:  included risk of possible headache,backache, failed block, allergic reaction, and nerve injury. This patient was asked if she had any questions or concerns before the procedure started.      Cardiovascular regular Normal   Neuro/Psych (+) {AN ROS/MED HX NEURO HEADACHES (+) PSYCHIATRIC DISORDERS, Negative Neurological ROS   GI/Hepatic/Renal negative GI ROS, negative Liver ROS, and negative Renal ROS (+)  GERD      Endo/Other  Negative Endocrine ROS (+)   Abdominal   Musculoskeletal  Hematology negative hematology ROS (+)   Peds  Reproductive/Obstetrics (+) Pregnancy          Anesthesia Physical Anesthesia Plan  ASA: II  Anesthesia Plan: Epidural   Post-op Pain Management:    Induction:   Airway Management Planned:   Additional Equipment:   Intra-op Plan:   Post-operative Plan:   Informed Consent: I have reviewed the patients History and Physical, chart, labs and discussed the procedure including the risks, benefits and alternatives for the proposed anesthesia with the patient or authorized representative who has indicated his/her understanding and acceptance.   Dental Advisory Given  Plan Discussed with: CRNA and Surgeon  Anesthesia Plan Comments:        Anesthesia Quick Evaluation

## 2011-05-08 NOTE — Anesthesia Procedure Notes (Addendum)
Epidural Patient location during procedure: OB Start time: 05/08/2011 8:12 AM Reason for block: procedure for pain  Staffing Anesthesiologist: Jiles Garter Performed by: anesthesiologist   Preanesthetic Checklist Completed: patient identified, site marked, surgical consent, pre-op evaluation, timeout performed, IV checked, risks and benefits discussed and monitors and equipment checked  Epidural Patient position: sitting Prep: DuraPrep Patient monitoring: blood pressure, continuous pulse ox and heart rate Approach: midline Injection technique: LOR air  Needle Needle type: Tuohy  Needle gauge: 17 G Needle length: 9 cm Catheter type: closed end flexible Catheter size: 19 Gauge Test dose: negative  Assessment Events: blood not aspirated, injection not painful, no injection resistance, negative IV test and no paresthesia

## 2011-05-08 NOTE — Anesthesia Postprocedure Evaluation (Signed)
  Anesthesia Post-op Note  Patient: Sydney Stewart  Procedure(s) Performed:  CESAREAN SECTION - Primary cesarean section with delivery of baby boy at 2055. Apgars 9/9. Cord blood x2 sent to nursery. Uterus massaged by S. Satterfield RN  Patient Location: PACU  Anesthesia Type: Epidural  Level of Consciousness: awake, alert  and oriented  Airway and Oxygen Therapy: Patient Spontanous Breathing  Post-op Pain: none  Post-op Assessment: Post-op Vital signs reviewed and Patient's Cardiovascular Status Stable  Post-op Vital Signs: Reviewed and stable  Complications: No apparent anesthesia complications

## 2011-05-08 NOTE — H&P (Signed)
Sydney Stewart is a 30 y.o. female presenting for IOL.   Maternal Medical History:  Fetal activity: Perceived fetal activity is normal.   Last perceived fetal movement was within the past hour.      OB History    Grav Para Term Preterm Abortions TAB SAB Ect Mult Living   1 0 0 0 0 0 0 0 0 0      History reviewed. No pertinent past medical history. Past Surgical History  Procedure Date  . S/p iud    Family History: family history includes Cancer in her other. Social History:  reports that she has never smoked. She has never used smokeless tobacco. She reports that she does not drink alcohol or use illicit drugs.  ROS  Dilation: 2 Effacement (%): 80 Station: -2 Exam by:: l.poore, rn Blood pressure 122/86, pulse 84, temperature 98.1 F (36.7 C), temperature source Oral, resp. rate 18, height 5\' 6"  (1.676 m), weight 80.74 kg (178 lb), unknown if currently breastfeeding. Maternal Exam:  Uterine Assessment: Contraction strength is firm.  Contraction frequency is regular.   Abdomen: Patient reports no abdominal tenderness. Estimated fetal weight is 8-8.5#.   Fetal presentation: vertex     Physical Exam  Constitutional: She is oriented to person, place, and time. She appears well-developed.  HENT:  Head: Normocephalic.  Neck: Normal range of motion. Neck supple.  Cardiovascular: Normal rate and regular rhythm.   Respiratory: Effort normal and breath sounds normal.  Musculoskeletal: Normal range of motion.  Neurological: She is alert and oriented to person, place, and time.  Psychiatric: She has a normal mood and affect.    Prenatal labs: ABO, Rh:   Antibody: Negative (11/29 0000) Rubella:   RPR: NON REACTIVE (07/09 2215)  HBsAg: Negative (11/29 0000)  HIV: Non-reactive (11/29 0000)  GBS: neg     Assessment/Plan: Admit pt for IOL Epidural PRN Exp mngt   Tani Virgo 05/08/2011, 7:32 AM

## 2011-05-08 NOTE — Anesthesia Postprocedure Evaluation (Deleted)
  Anesthesia Post-op Note  Patient: Sydney Stewart  Procedure(s) Performed:  CESAREAN SECTION  Patient Location: PACU  Anesthesia Type: Epidural  Level of Consciousness: awake, alert  and oriented  Airway and Oxygen Therapy: Patient Spontanous Breathing  Post-op Pain: none  Post-op Assessment: Post-op Vital signs reviewed, Respiratory Function Stable, No signs of Nausea or vomiting and Pain level controlled  Post-op Vital Signs: Reviewed and stable  Complications: No apparent anesthesia complications

## 2011-05-08 NOTE — Progress Notes (Signed)
  Pt comfortable w/ epidural  FHT reassuring w/ accels Toco Q1-5 Cvx 4/90/-2 AROM -  Moderate meconium  A/P:  Continue w/ IOL.  Exp mngt

## 2011-05-09 LAB — CBC
MCH: 32.8 pg (ref 26.0–34.0)
MCHC: 34.8 g/dL (ref 30.0–36.0)
MCV: 94.2 fL (ref 78.0–100.0)
Platelets: 149 10*3/uL — ABNORMAL LOW (ref 150–400)
RBC: 3.29 MIL/uL — ABNORMAL LOW (ref 3.87–5.11)
RDW: 12.6 % (ref 11.5–15.5)

## 2011-05-09 MED ORDER — CHLOROPROCAINE HCL 3 % IJ SOLN
INTRAMUSCULAR | Status: AC
  Filled 2011-05-09: qty 20

## 2011-05-09 MED ORDER — SODIUM BICARBONATE 8.4 % IV SOLN
INTRAVENOUS | Status: AC
  Filled 2011-05-09: qty 50

## 2011-05-09 NOTE — Progress Notes (Signed)
Infant took few sucks at breast and would slip to tip.  Shells given by RN. Encouraged to wear with bra.  Infant became sleepy and would not re-latch.  Would not suck on gloved finger.  Suck-training technique taught to parents.  Encouraged to pump with personal Medela DEBP and feed EBM to infant via dropper or spoon given and demonstrated use.  Parents verbalized understanding.  Encouraged to call for assistance. 

## 2011-05-09 NOTE — Progress Notes (Signed)
Subjective: Postpartum Day 1: Cesarean Delivery Patient reports: No complaints Objective: Vital signs in last 24 hours: Temp:  [97.8 F (36.6 C)-99.2 F (37.3 C)] 98.1 F (36.7 C) (07/11 0400) Pulse Rate:  [52-118] 87  (07/11 0400) Resp:  [16-20] 18  (07/11 0400) BP: (92-140)/(42-102) 92/57 mmHg (07/11 0400) SpO2:  [95 %-99 %] 98 % (07/11 0600)  Physical Exam:  General: alert, cooperative and no distress Lochia: appropriate Uterine Fundus: firm U/E Abd: soft with decreased BS Incision: healing well, no significant drainage, no dehiscence, no significant erythema DVT Evaluation: No evidence of DVT seen on physical exam.   Basename 05/09/11 0528 05/07/11 2215  HGB 10.8* 12.5  HCT 31.0* 35.7*    Assessment/Plan: Status post Cesarean section. Doing well postoperatively.  Continue current care. Encouraged warm beverages  Sharell Hilmer G 05/09/2011, 9:04 AM

## 2011-05-09 NOTE — Progress Notes (Signed)
Infant took few sucks at breast and would slip to tip.  Shells given by [employer]. Encouraged to wear with bra.  Infant became sleepy and would not re-latch.  Would not suck on gloved finger.  Suck-training technique taught to parents.  Encouraged to pump with personal Medela DEBP and feed EBM to infant via dropper or spoon given and demonstrated use.  Parents verbalized understanding.  Encouraged to call for assistance.

## 2011-05-10 NOTE — Progress Notes (Signed)
Baby is now 33 hours old and nursed 1 time for 10 minutes, has been taking EBM by spoon feeding 1-58ml. Has void 2 times since birth, 1 time today. With evaluation baby has disorganized suck will suckle 1-2 times then stop, mom reports this is what baby does at breast but falls asleep. Gave baby 3ml EBM with bottle and slow flow nipple, demonstrated suck training with bottle to mom. Attempted to latch baby with nipple shield #20 and 37fr feeding tube to supplement, baby would not suck but 1-2 times then falls asleep. This happened with multiple attempts. Awakened baby with warm wash cloth and mom was able to get baby to take the the remaining 15ml of EBM with bottle and slow flow nipple for a total of 18ml. Instructed mom to pump every 3 hours for 15 minutes, give baby any amount of EBM, or EBM/Formula to equal 15-20 ml with bottle and slow flow nipple, doing suck training (tug of war) with nipple tonight. If baby becomes more awake and eager to suck can try at breast with or without nipple shield otherwise will attempt to put back to breast in am. On exam baby does appear to have short frenulum with heart shape to end of tongue. Parents may discuss with Ped in am.

## 2011-05-10 NOTE — Progress Notes (Signed)
Subjective: Postpartum Day 2: Cesarean Delivery Patient reports tolerating PO.    Objective: Vital signs in last 24 hours: Temp:  [98 F (36.7 C)-98.1 F (36.7 C)] 98.1 F (36.7 C) (07/12 0649) Pulse Rate:  [67-77] 77  (07/12 0649) Resp:  [18-20] 18  (07/12 0649) BP: (92-98)/(55-66) 97/66 mmHg (07/12 0649) SpO2:  [98 %] 98 % (07/11 1225)  Physical Exam:  General: alert, cooperative and no distress Lochia: appropriate Uterine Fundus: firm U/Even abd soft + flatus Incision: healing well DVT Evaluation: No evidence of DVT seen on physical exam.   Basename 05/09/11 0528 05/07/11 2215  HGB 10.8* 12.5  HCT 31.0* 35.7*    Assessment/Plan: Status post Cesarean section. Doing well postoperatively.  Continue current care.  Kinzi Frediani G 05/10/2011, 10:08 AM

## 2011-05-11 NOTE — Transfer of Care (Signed)
Immediate Anesthesia Transfer of Care Note  Patient: Sydney Stewart  Procedure(s) Performed:  CESAREAN SECTION - Primary cesarean section with delivery of baby boy at 2055. Apgars 9/9. Cord blood x2 sent to nursery. Uterus massaged by S. Satterfield RN  Patient Location: PACU  Anesthesia Type: Regional  Level of Consciousness: patient cooperative  Airway & Oxygen Therapy: Patient Spontanous Breathing  Post-op Assessment: Report given to PACU RN and Post -op Vital signs reviewed and stable  Post vital signs: Reviewed and stable  Complications: No apparent anesthesia complications

## 2011-05-11 NOTE — Addendum Note (Signed)
Addendum  created 05/11/11 1337 by Randa Spike   Modules edited:Anesthesia Events, Notes Section

## 2011-05-11 NOTE — Discharge Summary (Signed)
Obstetric Discharge Summary Reason for Admission: cesarean section Prenatal Procedures: none Intrapartum Procedures: cesarean: low cervical, transverse Postpartum Procedures: none Complications-Operative and Postpartum: none  Hemoglobin  Date Value Range Status  05/09/2011 10.8* 12.0-15.0 (g/dL) Final     HCT  Date Value Range Status  05/09/2011 31.0* 36.0-46.0 (%) Final    Discharge Diagnoses: Term Pregnancy-delivered  Discharge Information: Date: 05/11/2011 Activity: pelvic rest Diet: routine Medications: Ibuprophen Condition: stable Instructions: refer to practice specific booklet Discharge to: home   Newborn Data: Live born  Information for the patient's newborn:  [place], [place] [[id number]]  female   Home with mother.  Elisabella Hacker G 05/11/2011, 8:56 AM

## 2011-05-11 NOTE — Progress Notes (Addendum)
Parents quite overwhelmed with all breastfeeding support and equiptment given to them while in hosp. Inst mother to offer breast with nipple shield before each feeding . She has been bottle feeding before offering breast and then pumping. Suggested that she use SNS , demonstrated use of Sns and =discussed use of with finger feeding . Scheduled Mother outpatient visit on July 20 ay 2:30.

## 2011-05-11 NOTE — Anesthesia Postprocedure Evaluation (Signed)
  Anesthesia Post-op Note  Patient: Sydney Stewart  Procedure(s) Performed:  CESAREAN SECTION - Primary cesarean section with delivery of baby boy at 2055. Apgars 9/9. Cord blood x2 sent to nursery. Uterus massaged by S. Satterfield RN  Patient Location: PACU  Anesthesia Type: Regional  Level of Consciousness: patient cooperative  Airway and Oxygen Therapy: Patient Spontanous Breathing  Post-op Pain: mild  Post-op Assessment: Post-op Vital signs reviewed and Patient's Cardiovascular Status Stable  Post-op Vital Signs: Reviewed and stable  Complications: No apparent anesthesia complications

## 2011-05-11 NOTE — Addendum Note (Signed)
Addendum  created 05/11/11 1337 by Creston Klas D Tonika Eden   Modules edited:Anesthesia Events, Notes Section    

## 2011-05-11 NOTE — Progress Notes (Signed)
Subjective: Postpartum Day 3: Cesarean Delivery Patient reports pulling sensation on R margin of incision Passed ? Tissue last pm. No heavy bleeding  Objective: Vital signs in last 24 hours: Temp:  [97.6 F (36.4 C)-98.5 F (36.9 C)] 97.6 F (36.4 C) (07/13 0527) Pulse Rate:  [58-84] 73  (07/13 0527) Resp:  [18-20] 18  (07/13 0527) BP: (104-117)/(67-73) 117/70 mmHg (07/13 0527) SpO2:  [98 %] 98 % (07/12 1433)  Physical Exam:  General: alert, cooperative and no distress Lochia: appropriate Uterine Fundus: firm ABd soft +BS + flatus Incision: healing well DVT Evaluation: No evidence of DVT seen on physical exam.   Basename 05/09/11 0528  HGB 10.8*  HCT 31.0*    Assessment/Plan: Status post Cesarean section. Doing well postoperatively.   Discharge home and RTO in 1 week for incision check Rx for Motrin 600 mg given.  [place] Droll G 05/11/2011, 8:52 AM

## 2011-05-13 NOTE — Anesthesia Preprocedure Evaluation (Signed)
Anesthesia Evaluation  Name, MR# and DOB Patient awake  General Assessment Comment  Reviewed: Allergy & Precautions, H&P  and Patient's Chart, lab work & pertinent test results  Airway Mallampati: II TM Distance: >3 FB Neck ROM: full    Dental No notable dental hx    Pulmonary  clear to auscultation  pulmonary exam normal   Cardiovascular Exercise Tolerance: Good regular Normal   Neuro/Psych  GI/Hepatic/Renal   Endo/Other   Abdominal   Musculoskeletal  Hematology   Peds  Reproductive/Obstetrics   Anesthesia Other Findings             Anesthesia Physical Anesthesia Plan  ASA: II  Anesthesia Plan:    Post-op Pain Management:    Induction:   Airway Management Planned:   Additional Equipment:   Intra-op Plan:   Post-operative Plan:   Informed Consent: I have reviewed the patients History and Physical, chart, labs and discussed the procedure including the risks, benefits and alternatives for the proposed anesthesia with the patient or authorized representative who has indicated his/her understanding and acceptance.     Plan Discussed with:   Anesthesia Plan Comments:         Anesthesia Quick Evaluation

## 2011-05-13 NOTE — Anesthesia Procedure Notes (Signed)
Procedures

## 2011-05-14 NOTE — Addendum Note (Signed)
Addendum  created 05/14/11 2006 by Randa Spike   Modules edited:Anesthesia Flowsheet

## 2011-05-29 ENCOUNTER — Encounter (HOSPITAL_COMMUNITY): Payer: Self-pay | Admitting: Obstetrics and Gynecology

## 2011-08-02 ENCOUNTER — Other Ambulatory Visit: Payer: Self-pay | Admitting: Dermatology

## 2011-08-21 ENCOUNTER — Other Ambulatory Visit (HOSPITAL_COMMUNITY): Payer: Self-pay | Admitting: Obstetrics and Gynecology

## 2011-08-21 DIAGNOSIS — R1903 Right lower quadrant abdominal swelling, mass and lump: Secondary | ICD-10-CM

## 2011-08-23 ENCOUNTER — Ambulatory Visit (HOSPITAL_COMMUNITY)
Admission: RE | Admit: 2011-08-23 | Discharge: 2011-08-23 | Disposition: A | Discharge status: Home / Self Care | Payer: Managed Care, Other (non HMO) | Source: Ambulatory Visit | Attending: Obstetrics and Gynecology | Admitting: Obstetrics and Gynecology

## 2011-08-23 ENCOUNTER — Other Ambulatory Visit (HOSPITAL_COMMUNITY): Payer: Self-pay | Admitting: Obstetrics and Gynecology

## 2011-08-23 DIAGNOSIS — R1903 Right lower quadrant abdominal swelling, mass and lump: Secondary | ICD-10-CM

## 2011-10-08 ENCOUNTER — Encounter: Payer: Self-pay | Admitting: Internal Medicine

## 2011-10-08 DIAGNOSIS — Z0000 Encounter for general adult medical examination without abnormal findings: Secondary | ICD-10-CM | POA: Insufficient documentation

## 2011-10-09 ENCOUNTER — Ambulatory Visit: Payer: Managed Care, Other (non HMO) | Admitting: Internal Medicine

## 2011-10-09 DIAGNOSIS — Z0289 Encounter for other administrative examinations: Secondary | ICD-10-CM

## 2011-10-19 ENCOUNTER — Ambulatory Visit (INDEPENDENT_AMBULATORY_CARE_PROVIDER_SITE_OTHER): Payer: Managed Care, Other (non HMO) | Admitting: Internal Medicine

## 2011-10-19 VITALS — BP 100/74 | HR 62 | Temp 97.9°F | Wt 139.0 lb

## 2011-10-19 DIAGNOSIS — F988 Other specified behavioral and emotional disorders with onset usually occurring in childhood and adolescence: Secondary | ICD-10-CM

## 2011-10-19 MED ORDER — LISDEXAMFETAMINE DIMESYLATE 50 MG PO CAPS: 50.0000 mg | ORAL_CAPSULE | ORAL | Status: DC

## 2011-10-19 NOTE — Progress Notes (Signed)
  Subjective:    Patient ID: Sydney Stewart, female    DOB: 11-Apr-1981, 30 y.o.   MRN: [id number]  HPI  Here to f/u;   Off vyvanse for about 13 months due to pregnancy and breast feeding, now discontinued..  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  Overall doing well, but work is demanding and although she is not in jeapordy of work loss due to performance, she prefers to re-start med as she feels she is not able to perform at her best due to discernable lack of focus, attention and task completion.  Pt denies chest pain, increased sob or doe, wheezing, orthopnea, PND, increased LE swelling, palpitations, dizziness or syncope.  Pt denies new neurological symptoms such as new headache, or facial or extremity weakness or numbness   Pt denies polydipsia, polyuria  Denies worsening depressive symptoms, suicidal ideation, or panic.  Has an opportunity to work for approx 5 mo in [place] early next yr No past medical history on file. Past Surgical History  Procedure Date  . S/p iud   . Cesarean section 05/08/2011    Procedure: CESAREAN SECTION;  Surgeon: Zelphia Cairo;  Location: [facility];  Service: Gynecology;;  . Cesarean section 05/08/2011    Procedure: CESAREAN SECTION;  Surgeon: Zelphia Cairo;  Location: [facility];  Service: Gynecology;  Laterality: N/A;  Primary cesarean section with delivery of baby boy at 2055. Apgars 9/9. Cord blood x2 sent to nursery. Uterus massaged by S. Satterfield RN    reports that she has never smoked. She has never used smokeless tobacco. She reports that she does not drink alcohol or use illicit drugs. family history includes Cancer in her other. No Known Allergies Current Outpatient Prescriptions on File Prior to Visit  Medication Sig Dispense Refill  . prenatal vitamin w/FE, FA (PRENATAL 1 + 1) 27-1 MG TABS Take 1 tablet by mouth daily.         iReview of Systems Review of Systems  Constitutional: Negative for diaphoresis and unexpected weight change.  HENT:  Negative for drooling and tinnitus.   Eyes: Negative for photophobia and visual disturbance.  Respiratory: Negative for choking and stridor.   Gastrointestinal: Negative for vomiting and blood in stool.  Genitourinary: Negative for hematuria and decreased urine volume.  Objective:   Physical Exam BP 100/74  Pulse 62  Temp(Src) 97.9 F (36.6 C) (Oral)  Wt 139 lb (63.05 kg)  SpO2 97% Physical Exam  VS noted Constitutional: Pt appears well-developed and well-nourished.  HENT: Head: Normocephalic.  Right Ear: External ear normal.  Left Ear: External ear normal.  Eyes: Conjunctivae and EOM are normal. Pupils are equal, round, and reactive to light.  Neck: Normal range of motion. Neck supple.  Cardiovascular: Normal rate and regular rhythm.   Pulmonary/Chest: Effort normal and breath sounds normal.  Abd:  Soft, NT, non-distended, + BS Neurological: Pt is alert. No cranial nerve deficit.  Skin: Skin is warm. No erythema.  Psychiatric: Pt behavior is normal. Thought content normal.     Assessment & Plan:

## 2011-10-19 NOTE — Patient Instructions (Signed)
Take all new medications as prescribed Continue all other medications as before Please return in 1 year for your yearly visit, or sooner if needed

## 2011-10-20 ENCOUNTER — Encounter: Payer: Self-pay | Admitting: Internal Medicine

## 2011-10-20 NOTE — Assessment & Plan Note (Signed)
stable overall by hx and exam, most recent data reviewed with pt, and pt to continue medical treatment as before - for med refill Lab Results  Component Value Date   WBC 23.7* 05/09/2011   HGB 10.8* 05/09/2011   HCT 31.0* 05/09/2011   PLT 149* 05/09/2011   GLUCOSE 122* 11/29/2006   CHOL 184 11/29/2006   TRIG 50 11/29/2006   HDL 55.1 11/29/2006   LDLCALC 119* 11/29/2006   ALT 19 11/29/2006   AST 24 11/29/2006   NA 139 11/29/2006   K 3.7 11/29/2006   CL 104 11/29/2006   CREATININE 0.5 11/29/2006   BUN 5* 11/29/2006   CO2 29 11/29/2006   TSH 1.84 11/29/2006   HGBA1C 5.3 11/29/2006

## 2011-12-07 IMAGING — US US PELVIS LIMITED
1 series · 14 of 18 positions shown · non-contrast
Comparison: None.

CLINICAL DATA: Status post C-section on 05/08/2011 with superficial
knot felt in the right lower quadrant.

US PELVIS LIMITED OR FOLLOW UP

[Series 1: us pelvis limited · 18 acquisitions, 14 frames shown]
[im 1/18]
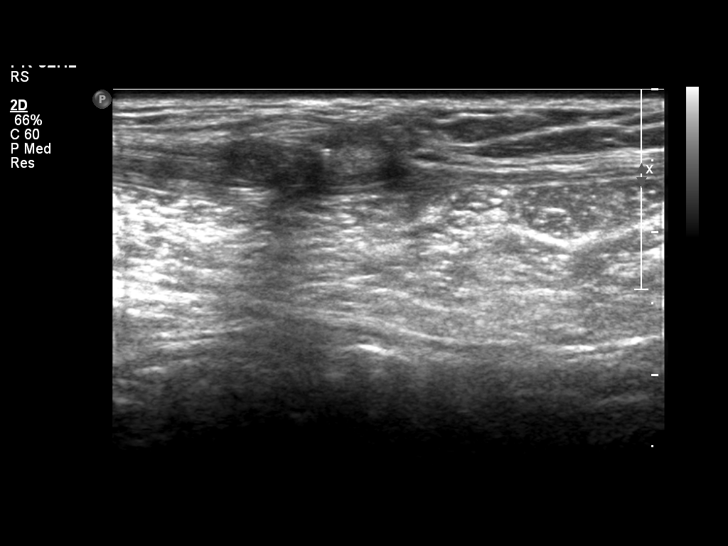
[im 2/18]
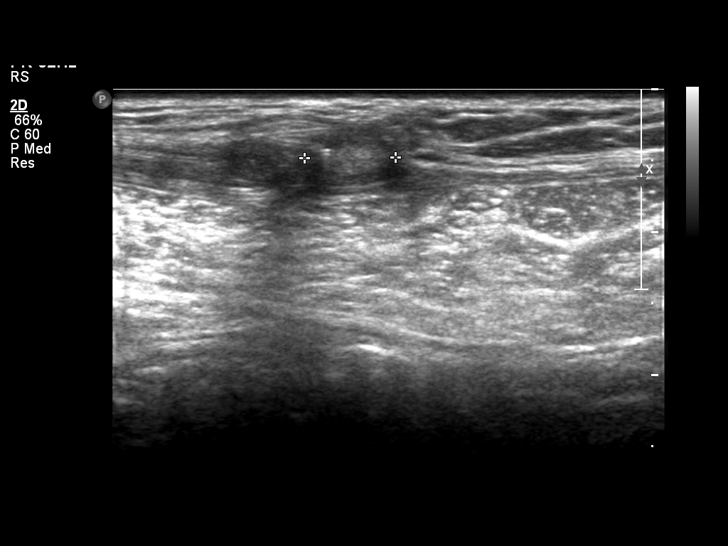
[im 4/18]
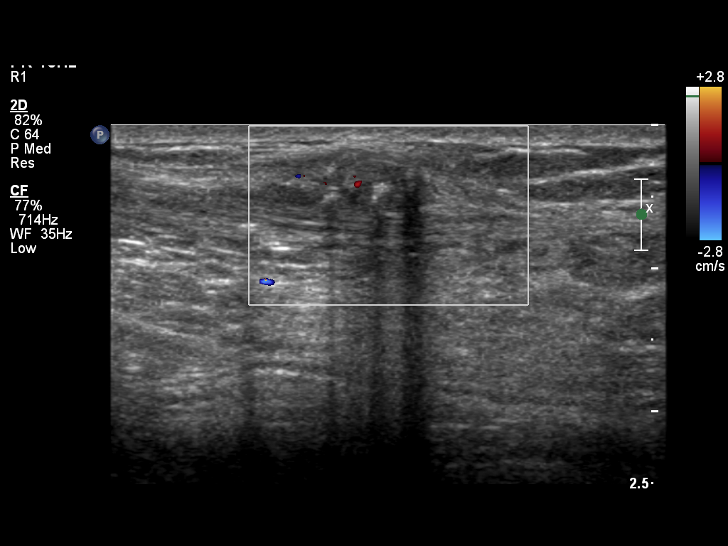
[im 5/18]
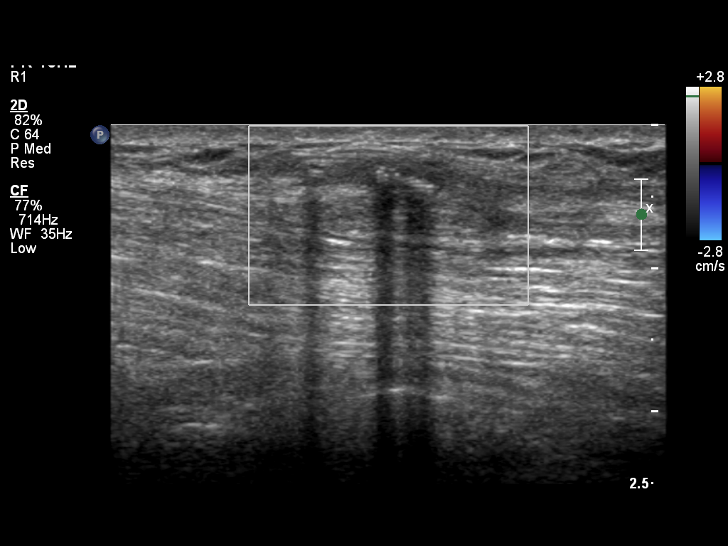
[im 6/18]
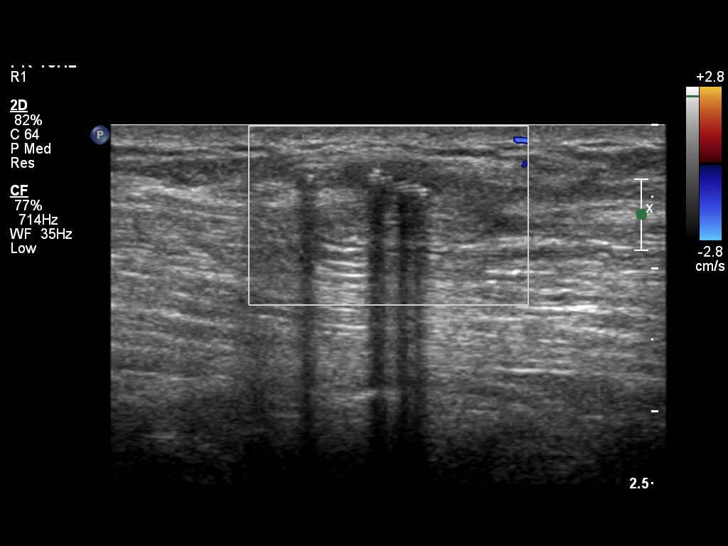
[im 8/18]
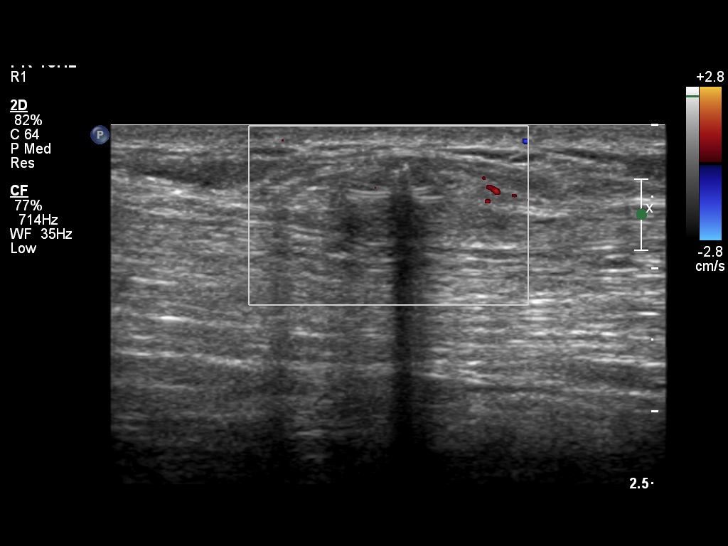
[im 9/18]
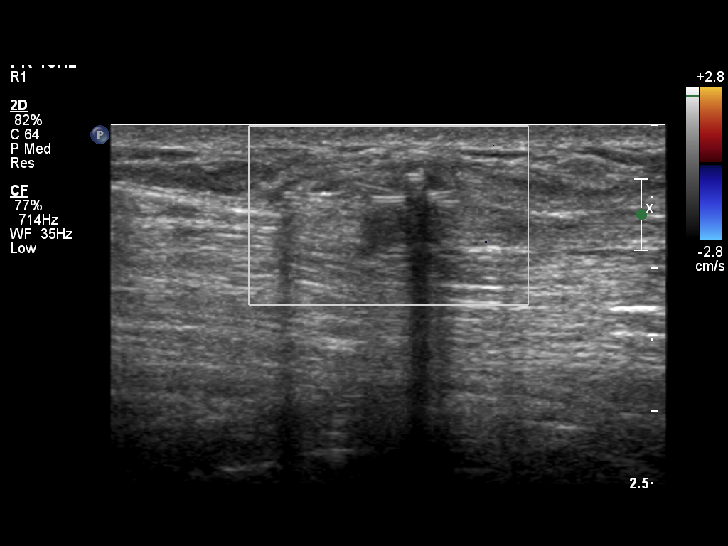
[im 10/18]
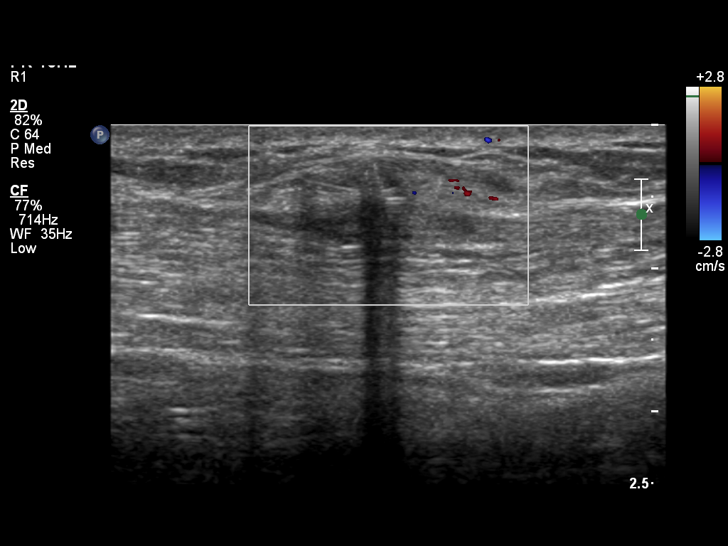
[im 11/18]
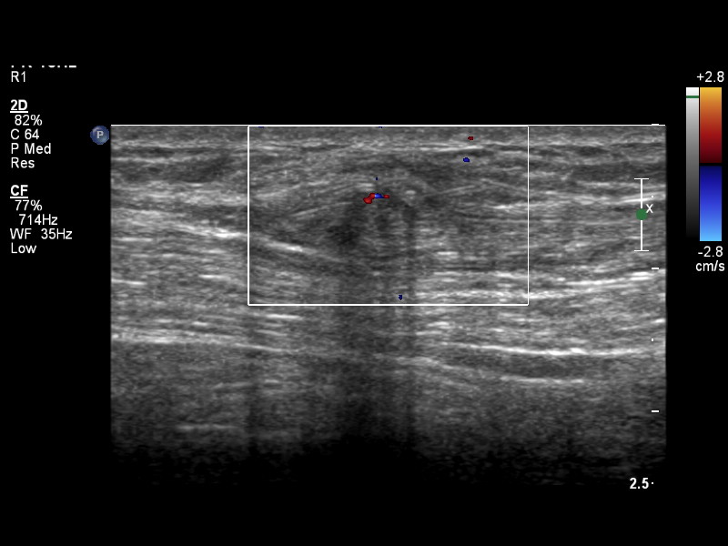
[im 13/18]
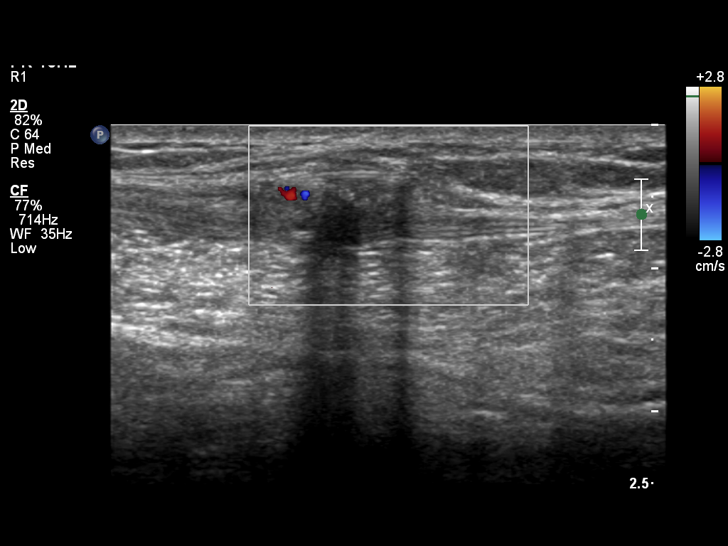
[im 14/18]
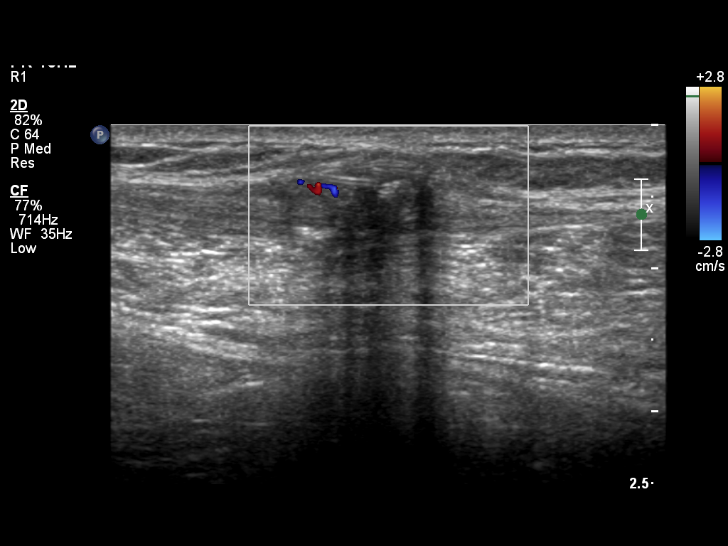
[im 15/18]
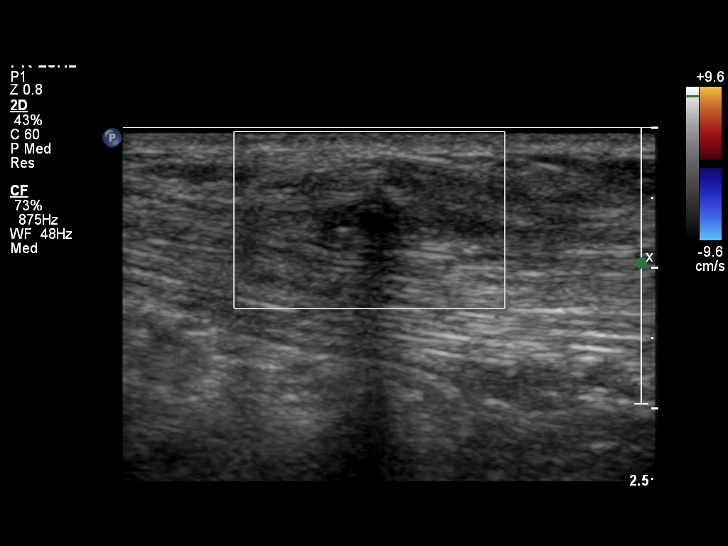
[im 17/18]
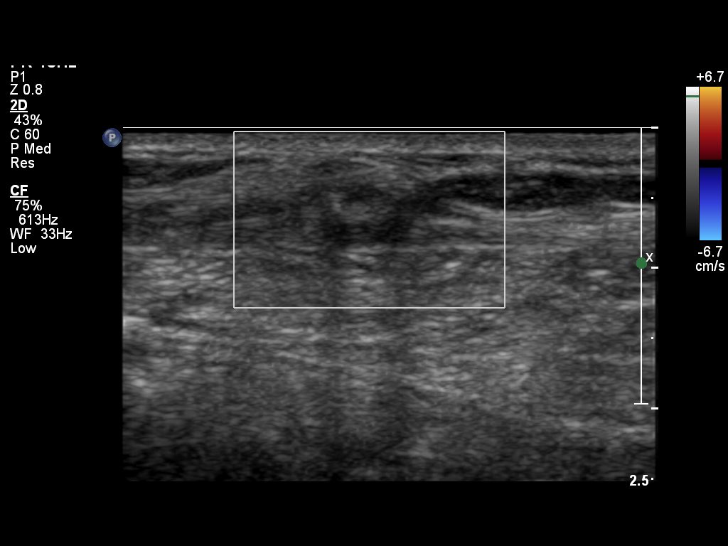
[im 18/18]
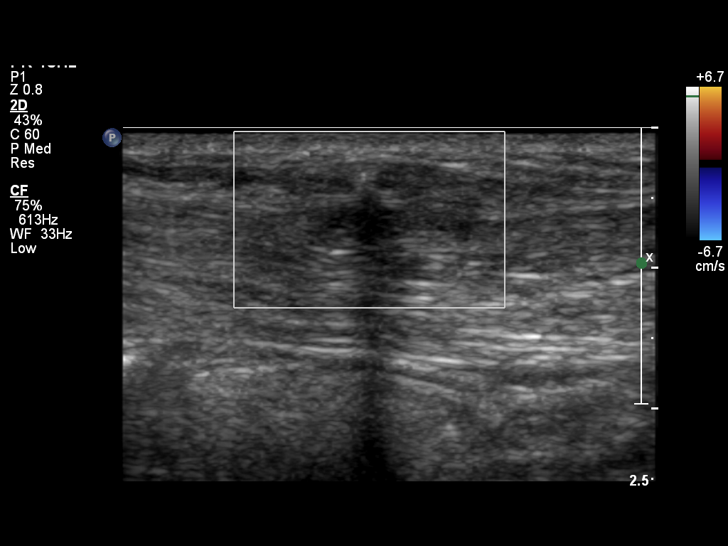

[14 of 18 positions shown; findings below may reference images not displayed]

FINDINGS: Over the region of the palpable abnormality within the
subcutaneous fat, there is a circumscribed soft tissue nodule
measuring 6.4 by 3.6 by 7.2 mm.  Located centrally within this area
is a shadowing hyperechoic linear focus which measures 4.0 by
mm in size.  This has an appearance suggestive of a staple or
possibly a suture and raises the possibility that the surrounding
nodule represents a granuloma.
IMPRESSION: Findings suspicious for a staple or sutural granuloma accounting
for the palpable soft tissue nodule

## 2012-01-21 ENCOUNTER — Other Ambulatory Visit: Payer: Self-pay

## 2012-01-21 MED ORDER — LISDEXAMFETAMINE DIMESYLATE 50 MG PO CAPS: 50.0000 mg | ORAL_CAPSULE | ORAL | Status: DC

## 2012-01-21 NOTE — Telephone Encounter (Signed)
Done hardcopy to robin  

## 2012-01-21 NOTE — Telephone Encounter (Signed)
Pt called requesting 3 month refill of Vyvnase

## 2012-01-22 MED ORDER — LISDEXAMFETAMINE DIMESYLATE 50 MG PO CAPS: 50.0000 mg | ORAL_CAPSULE | ORAL | Status: DC

## 2012-01-22 NOTE — Telephone Encounter (Signed)
Patient is requesting a 3 month refill, please advise

## 2012-01-22 NOTE — Telephone Encounter (Signed)
Done hardcopy to robin  

## 2012-01-22 NOTE — Telephone Encounter (Signed)
Called the patient left detailed message that prescription requested is ready for pickup at front desk.

## 2012-02-06 ENCOUNTER — Telehealth: Payer: Self-pay

## 2012-02-06 NOTE — Telephone Encounter (Signed)
Ok for early refill of the rx dated for apr 24

## 2012-02-06 NOTE — Telephone Encounter (Signed)
Ok this one time only

## 2012-02-06 NOTE — Telephone Encounter (Signed)
Pharmacy called requesting authorization for early refill of Vyvanse (April) because pt will be going out of the country for 6 weeks. Please advise.

## 2012-02-07 NOTE — Telephone Encounter (Signed)
Pharmacy informed ok to fill early.

## 2012-04-07 ENCOUNTER — Telehealth: Payer: Self-pay | Admitting: Internal Medicine

## 2012-04-07 MED ORDER — LISDEXAMFETAMINE DIMESYLATE 50 MG PO CAPS: 50.0000 mg | ORAL_CAPSULE | ORAL | Status: DC

## 2012-04-07 NOTE — Telephone Encounter (Signed)
Next rx due June 23   -   Done total x 3 mo - Done hardcopy to [employer]

## 2012-04-07 NOTE — Telephone Encounter (Signed)
Patient informed Rx[s] ready for p/u Mon-Fri 8a-5p/SLS

## 2012-04-07 NOTE — Telephone Encounter (Signed)
Caller: Sydney Stewart/Patient is calling with a question about Vyvanse.The medication was written by Oliver Barre.States she needs new Rx for 3 months; knows she must pick up hard copy at office.  Runs out of medication "in a couple days.  Information noted and sent to provider per Medication Questions - Adult protocol.

## 2012-05-02 ENCOUNTER — Telehealth: Payer: Self-pay

## 2012-05-02 NOTE — Telephone Encounter (Signed)
Matt with pharmacy called stating that pt is leaving to head out of the country [place] on 05/03/12 and would like to know if ok for early refill. Per Dr Posey Rea, Jonny Ruiz out of office), ok to refill early. Pharmacy notified early fill approved

## 2012-12-22 ENCOUNTER — Other Ambulatory Visit: Payer: Self-pay | Admitting: Dermatology

## 2013-01-12 ENCOUNTER — Encounter: Payer: Self-pay | Admitting: Internal Medicine

## 2013-01-12 ENCOUNTER — Ambulatory Visit (INDEPENDENT_AMBULATORY_CARE_PROVIDER_SITE_OTHER): Payer: Managed Care, Other (non HMO) | Admitting: Internal Medicine

## 2013-01-12 VITALS — BP 112/72 | HR 56 | Temp 97.0°F | Ht 66.0 in | Wt 129.5 lb

## 2013-01-12 DIAGNOSIS — F988 Other specified behavioral and emotional disorders with onset usually occurring in childhood and adolescence: Secondary | ICD-10-CM

## 2013-01-12 DIAGNOSIS — M542 Cervicalgia: Secondary | ICD-10-CM | POA: Insufficient documentation

## 2013-01-12 MED ORDER — LISDEXAMFETAMINE DIMESYLATE 40 MG PO CAPS: 40.0000 mg | ORAL_CAPSULE | ORAL | Status: DC

## 2013-01-12 NOTE — Progress Notes (Signed)
  Subjective:    Patient ID: Sydney Stewart, female    DOB: 06-Feb-1981, 32 y.o.   MRN: [id number]  HPI  Here to f/u, has several wks bilat upper back and neck achy mild to mod neck pain, intermittent, worse to pick up her 30 lb 20 mo old chil, but Pt no bowel or bladder change, fever, wt loss,  worsening LE pain/numbness/weakness, gait change or falls.  Also, Asks for lower dose med for ADD, re-starting new job, has not functioned well in past without, but suspects she may be able to do better with lower dose vyvanse.  Pt denies chest pain, increased sob or doe, wheezing, orthopnea, PND, increased LE swelling, palpitations, dizziness or syncope.   Pt denies polydipsia, polyuria.  Pt denies new neurological symptoms such as new headache, or facial or extremity weakness or numbness. Asks for chiropracter as insurance wil cover No past medical history on file. - ADD Past Surgical History  Procedure Laterality Date  . S/p iud    . Cesarean section  05/08/2011    Procedure: CESAREAN SECTION;  Surgeon: Zelphia Cairo;  Location: [facility];  Service: Gynecology;;  . Cesarean section  05/08/2011    Procedure: CESAREAN SECTION;  Surgeon: Zelphia Cairo;  Location: [facility];  Service: Gynecology;  Laterality: N/A;  Primary cesarean section with delivery of baby boy at 2055. Apgars 9/9. Cord blood x2 sent to nursery. Uterus massaged by S. Satterfield RN    reports that she has never smoked. She has never used smokeless tobacco. She reports that she does not drink alcohol or use illicit drugs. family history includes Cancer in her other. No Known Allergies Current Outpatient Prescriptions on File Prior to Visit  Medication Sig Dispense Refill  . prenatal vitamin w/FE, FA (PRENATAL 1 + 1) 27-1 MG TABS Take 1 tablet by mouth daily.         No current facility-administered medications on file prior to visit.   ,Review of Systems All otherwise neg per pt     Objective:   Physical Exam BP 112/72  Pulse 56   Temp(Src) 97 F (36.1 C) (Oral)  Ht 5\' 6"  (1.676 m)  Wt 129 lb 8 oz (58.741 kg)  BMI 20.91 kg/m2  SpO2 98% VS noted,  Constitutional: Pt appears well-developed and well-nourished.  HENT: Head: NCAT.  Right Ear: External ear normal.  Left Ear: External ear normal.  Eyes: Conjunctivae and EOM are normal. Pupils are equal, round, and reactive to light.  Spine nontender Neck: Normal range of motion. Neck supple. FROM, mild tender bilat paracervical Cardiovascular: Normal rate and regular rhythm.   Pulmonary/Chest: Effort normal and breath sounds normal.  Neurological: Pt is alert. Not confused , motor/sens/dtr intact Skin: Skin is warm. No erythema. No rash Psychiatric: Pt behavior is normal. Thought content normal.     Assessment & Plan:

## 2013-01-12 NOTE — Patient Instructions (Addendum)
Please continue all other medications as before, and refills have been done if requested. You are given the note today You will be contacted regarding the referral for: chiropracter Please remember to followup with your GYN for the yearly pap smear and/or mammogram Thank you for enrolling in MyChart. Please follow the instructions below to securely access your online medical record. MyChart allows you to send messages to your doctor, view your test results, renew your prescriptions, schedule appointments, and more. To Log into My Chart online, please go by [employer] or [employer] to [email or web address], or download the MyChart App from the [employer] of [employer] .  Your Username is: [email or web address] Please return in 1 year for your yearly visit, or sooner if needed

## 2013-01-12 NOTE — Assessment & Plan Note (Signed)
Ok for vyvanse 40 qd,  to f/u any worsening symptoms or concerns

## 2013-01-12 NOTE — Assessment & Plan Note (Signed)
C/w mild MSK, decliens flexeril prn, for chiropracter referral

## 2013-01-27 ENCOUNTER — Telehealth: Payer: Self-pay | Admitting: Internal Medicine

## 2013-01-27 NOTE — Telephone Encounter (Signed)
Pharmacy informed.

## 2013-01-27 NOTE — Telephone Encounter (Signed)
Caller: Kristen/RPh at [employer] at [employer] Phone: [phone]; Reason for Call: 3rd call to office; Asking for approval for early refill of Vyvance.  Patient is going out of town or possibly out of the country 01/27/13.  Pharmacist had no response to 2 previous calls to office at 1000 and 1400.   Vyvance refill is not due until 02/11/13.  Please call back ASAP.

## 2013-01-27 NOTE — Telephone Encounter (Signed)
Not sure what happened with the prior calls -   Ok for early refill

## 2013-05-20 ENCOUNTER — Telehealth: Payer: Self-pay | Admitting: *Deleted

## 2013-05-20 MED ORDER — LISDEXAMFETAMINE DIMESYLATE 40 MG PO CAPS: 40.0000 mg | ORAL_CAPSULE | ORAL | Status: DC

## 2013-05-20 NOTE — Telephone Encounter (Signed)
Done hardcopy to robin  

## 2013-05-20 NOTE — Telephone Encounter (Signed)
Refill request for Vyvanse 40mg . - 3 month supply.  Last OV 3.17.14 Last filled 5.16.14

## 2013-05-20 NOTE — Telephone Encounter (Signed)
Called the patient left a detailed message that prescriptions requested have been filled and hardcopy's can be picked up at the front desk at her convenience.

## 2013-06-01 ENCOUNTER — Telehealth: Payer: Self-pay

## 2013-06-01 NOTE — Telephone Encounter (Signed)
Fax from pharmacy requesting a refill on Vyvanse.  States the patient will be out of the country and needs authorization for refills on Vyvanse for 6 months.

## 2013-06-02 NOTE — Telephone Encounter (Signed)
unfortately I am unable to do this, as we are only allowed by law to do 3 mo at one time, and she has just gotten her July, aug, sept prescriptoins

## 2013-06-02 NOTE — Telephone Encounter (Signed)
Pharmacy informed.

## 2013-07-13 LAB — OB RESULTS CONSOLE RPR: RPR: NONREACTIVE

## 2013-07-13 LAB — OB RESULTS CONSOLE RUBELLA ANTIBODY, IGM: RUBELLA: NON-IMMUNE/NOT IMMUNE

## 2013-07-13 LAB — OB RESULTS CONSOLE HIV ANTIBODY (ROUTINE TESTING): HIV: NONREACTIVE

## 2013-09-03 ENCOUNTER — Other Ambulatory Visit: Payer: Self-pay

## 2014-02-16 ENCOUNTER — Encounter (HOSPITAL_BASED_OUTPATIENT_CLINIC_OR_DEPARTMENT_OTHER): Payer: Self-pay | Admitting: Emergency Medicine

## 2014-02-16 ENCOUNTER — Emergency Department (HOSPITAL_BASED_OUTPATIENT_CLINIC_OR_DEPARTMENT_OTHER)
Admission: EM | Admit: 2014-02-16 | Discharge: 2014-02-16 | Disposition: A | Discharge status: Home / Self Care | Payer: Managed Care, Other (non HMO) | Attending: Emergency Medicine | Admitting: Emergency Medicine

## 2014-02-16 DIAGNOSIS — O9989 Other specified diseases and conditions complicating pregnancy, childbirth and the puerperium: Secondary | ICD-10-CM | POA: Insufficient documentation

## 2014-02-16 DIAGNOSIS — J029 Acute pharyngitis, unspecified: Secondary | ICD-10-CM | POA: Insufficient documentation

## 2014-02-16 DIAGNOSIS — Z79899 Other long term (current) drug therapy: Secondary | ICD-10-CM | POA: Insufficient documentation

## 2014-02-16 DIAGNOSIS — F988 Other specified behavioral and emotional disorders with onset usually occurring in childhood and adolescence: Secondary | ICD-10-CM | POA: Insufficient documentation

## 2014-02-16 DIAGNOSIS — O99340 Other mental disorders complicating pregnancy, unspecified trimester: Secondary | ICD-10-CM | POA: Insufficient documentation

## 2014-02-16 LAB — RAPID STREP SCREEN (MED CTR MEBANE ONLY): STREPTOCOCCUS, GROUP A SCREEN (DIRECT): NEGATIVE

## 2014-02-16 MED ORDER — LIDOCAINE VISCOUS 2 % MT SOLN: 15.0000 mL | OROMUCOSAL | Status: DC | PRN

## 2014-02-16 MED ORDER — LIDOCAINE VISCOUS 2 % MT SOLN
15.0000 mL | Freq: Once | OROMUCOSAL | Status: AC
  Administered 2014-02-16: 15 mL via OROMUCOSAL
  Filled 2014-02-16: qty 15

## 2014-02-16 NOTE — Discharge Instructions (Signed)
As discussed, your evaluation today has been largely reassuring.  But, it is important that you monitor your condition carefully, and do not hesitate to return to the ED if you develop new, or concerning changes in your condition. ? ?Otherwise, please follow-up with your physician for appropriate ongoing care. ? ?

## 2014-02-16 NOTE — ED Provider Notes (Signed)
CSN: [id number]     Arrival date & time 02/16/14  0805 History   First MD Initiated Contact with Patient 02/16/14 [phone]     Chief Complaint  Patient presents with  . Sore Throat     (Consider location/radiation/quality/duration/timing/severity/associated sxs/prior Treatment) HPI Patient presents with sore throat. Symptoms began 5 days ago. Since the symptoms are persistent. There is diffuse sore throat, with no objective fever but no dyspnea, no vomiting, diarrhea, chest pain. There is pain with swallowing, but the patient remains tolerant of oral intake. Patient is [redacted] weeks pregnant, but that this pregnancy is uncomplicated, has no ongoing abdominal pain, or obstetric concerns. Patient states in the days prior to the onset of symptoms she used her toddler son was a toothbrush, but otherwise has had no memorable changes in her health / activity / diet.  Past Medical History  Diagnosis Date  . 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 S/p iud    . Cesarean section  05/08/2011    Procedure: CESAREAN SECTION;  Surgeon: Zelphia CairoGretchen Adkins;  Location: [facility];  Service: Gynecology;;  . Cesarean section  05/08/2011    Procedure: CESAREAN SECTION;  Surgeon: Zelphia CairoGretchen Adkins;  Location: [facility];  Service: Gynecology;  Laterality: N/A;  Primary cesarean section with delivery of baby boy at 2055. Apgars 9/9. Cord blood x2 sent to nursery. Uterus massaged by S. Satterfield RN   Family History  Problem Relation Age of Onset  . Cancer Other     Breast, lung and prostate cancer   History  Substance Use Topics  . Smoking status: Never Smoker   . Smokeless tobacco: Never Used  . Alcohol Use: No   OB History   Grav Para Term Preterm Abortions TAB SAB Ect Mult Living   2 1 1  0 0 0 0 0 0 1     Review of Systems  Constitutional:       Per HPI, otherwise negative  HENT:       Per HPI, otherwise negative   Respiratory:       Per HPI, otherwise negative  Cardiovascular:       Per HPI, otherwise negative  Gastrointestinal: Negative for vomiting.  Endocrine:       Negative aside from HPI  Genitourinary:       Neg aside from HPI   Musculoskeletal:       Per HPI, otherwise negative  Skin: Negative.   Neurological: Negative for syncope.      Allergies  Review of patient's allergies indicates no known allergies.  Home Medications   Prior to Admission medications   Medication Sig Start Date End Date Taking? Authorizing Provider  lisdexamfetamine (VYVANSE) 40 MG capsule Take 1 capsule (40 mg total) by mouth every morning. To fill sept 21, 2014 05/20/13   Corwin LevinsJames W John, MD  prenatal vitamin w/FE, FA (PRENATAL 1 + 1) 27-1 MG TABS Take 1 tablet by mouth daily.      Historical Provider, MD   BP 106/68  Temp(Src) 97.7 F (36.5 C)  Resp 18  Ht 5\' 6"  (1.676 m)  Wt 160 lb (72.576 kg)  BMI 25.84 kg/m2  SpO2 100% Physical Exam  Nursing note and vitals reviewed. Constitutional: She is oriented to person, place, and time. She appears well-developed and well-nourished. No distress.  Pregnant F resting in NAD  HENT:  Head: Normocephalic and atraumatic.  Right Ear: Hearing, tympanic membrane, external ear and ear canal normal.  Left Ear: Hearing, tympanic membrane, external ear and ear canal normal.  Mouth/Throat: Uvula is midline, oropharynx is clear and moist and mucous membranes are normal.  Eyes: Conjunctivae and EOM are normal.  Cardiovascular: Normal rate and regular rhythm.   Pulmonary/Chest: Effort normal and breath sounds normal. No stridor. No respiratory distress.  Abdominal: She exhibits no distension.  Musculoskeletal: She exhibits no edema.  Lymphadenopathy:    She has cervical adenopathy.       Right cervical: Superficial cervical adenopathy present. No posterior cervical adenopathy present.      Left cervical: Superficial cervical adenopathy present. No posterior cervical  adenopathy present.  Neurological: She is alert and oriented to person, place, and time. No cranial nerve deficit.  Skin: Skin is warm and dry.  Psychiatric: She has a normal mood and affect.   Patient requests additional test for influenza ED Course  Procedures (including critical care time) Labs Review Labs Reviewed  RAPID STREP SCREEN  INFLUENZA PANEL BY PCR (TYPE A & B, H1N1)   9:30 AM On repeat exam the patient says that she is feeling better having received medication.  We held a conversation about return precautions, follow up instructions. MDM   Final diagnoses:  Sore throat    Patient advanced in pregnancy presents with sore throat.  No evidence of respiratory compromise, sepsis, bacteremia, or systemic pathology. Patient's vital signs are stable, and she put her following topical medication. Patient was discharged in stable condition. On discharge influenza panel is pending, but the patient has no overt evidence of this pathology.    Gerhard Munchobert Michaline Kindig, MD 02/16/14 [phone]

## 2014-02-16 NOTE — ED Notes (Signed)
Pt sts sore throat x 5 days. Pt sts she is [redacted]weeks pregnant, has taken tylenol and claritin without relief. Has not been able to sleep at night d/t pain. Has also done salt water gargles, nasal saline spray without relief. +fever +chills

## 2014-02-17 LAB — INFLUENZA PANEL BY PCR (TYPE A & B)
H1N1 flu by pcr: NOT DETECTED
INFLAPCR: NEGATIVE
INFLBPCR: NEGATIVE

## 2014-02-18 ENCOUNTER — Inpatient Hospital Stay (HOSPITAL_COMMUNITY): Payer: Managed Care, Other (non HMO)

## 2014-02-18 ENCOUNTER — Inpatient Hospital Stay (HOSPITAL_COMMUNITY)
Admission: AD | Admit: 2014-02-18 | Discharge: 2014-02-18 | Disposition: A | Discharge status: Home / Self Care | Payer: Managed Care, Other (non HMO) | Source: Ambulatory Visit | Attending: Obstetrics and Gynecology | Admitting: Obstetrics and Gynecology

## 2014-02-18 ENCOUNTER — Encounter (HOSPITAL_COMMUNITY): Payer: Self-pay

## 2014-02-18 DIAGNOSIS — J069 Acute upper respiratory infection, unspecified: Secondary | ICD-10-CM | POA: Insufficient documentation

## 2014-02-18 DIAGNOSIS — O99891 Other specified diseases and conditions complicating pregnancy: Secondary | ICD-10-CM | POA: Insufficient documentation

## 2014-02-18 DIAGNOSIS — R0602 Shortness of breath: Secondary | ICD-10-CM | POA: Insufficient documentation

## 2014-02-18 DIAGNOSIS — R0989 Other specified symptoms and signs involving the circulatory and respiratory systems: Secondary | ICD-10-CM | POA: Insufficient documentation

## 2014-02-18 DIAGNOSIS — R0609 Other forms of dyspnea: Secondary | ICD-10-CM | POA: Insufficient documentation

## 2014-02-18 DIAGNOSIS — O9989 Other specified diseases and conditions complicating pregnancy, childbirth and the puerperium: Principal | ICD-10-CM

## 2014-02-18 DIAGNOSIS — J029 Acute pharyngitis, unspecified: Secondary | ICD-10-CM | POA: Insufficient documentation

## 2014-02-18 LAB — CBC
HEMATOCRIT: 34.4 % — AB (ref 36.0–46.0)
Hemoglobin: 12.1 g/dL (ref 12.0–15.0)
MCH: 33.2 pg (ref 26.0–34.0)
MCHC: 35.2 g/dL (ref 30.0–36.0)
MCV: 94.2 fL (ref 78.0–100.0)
PLATELETS: 168 10*3/uL (ref 150–400)
RBC: 3.65 MIL/uL — AB (ref 3.87–5.11)
RDW: 12.5 % (ref 11.5–15.5)
WBC: 17 10*3/uL — AB (ref 4.0–10.5)

## 2014-02-18 LAB — CULTURE, GROUP A STREP

## 2014-02-18 LAB — DIFFERENTIAL
BASOS ABS: 0.1 10*3/uL (ref 0.0–0.1)
BASOS PCT: 1 % (ref 0–1)
Eosinophils Absolute: 0.1 10*3/uL (ref 0.0–0.7)
Eosinophils Relative: 1 % (ref 0–5)
Lymphocytes Relative: 15 % (ref 12–46)
Lymphs Abs: 2.4 10*3/uL (ref 0.7–4.0)
MONOS PCT: 10 % (ref 3–12)
Monocytes Absolute: 1.7 10*3/uL — ABNORMAL HIGH (ref 0.1–1.0)
NEUTROS ABS: 12.2 10*3/uL — AB (ref 1.7–7.7)
NEUTROS PCT: 74 % (ref 43–77)

## 2014-02-18 MED ORDER — PROMETHAZINE-CODEINE 6.25-10 MG/5ML PO SYRP: 7.5000 mL | ORAL_SOLUTION | Freq: Four times a day (QID) | ORAL | Status: DC | PRN

## 2014-02-18 NOTE — MAU Provider Note (Signed)
Chief Complaint:  Shortness of Breath and Sore Throat   First Provider Initiated Contact with Patient 02/18/14 [phone]     HPI: Sydney Stewart is a 33 y.o. G2P1001 at 4725w1d who presents to maternity admissions reporting difficulty breathing. Severe sore throat x 6 days. Neg strep A and Flu PCR 2 days ago. Was started on Z Pack yesterday. Sore throat improving, but developed productive cough yesterday and started feeling like she was drowning when lying down since last night. Sx mostly relieved by sitting upright.  Past Medical History: Past Medical History  Diagnosis Date  . 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 S/p iud    . Cesarean section  05/08/2011    Procedure: CESAREAN SECTION;  Surgeon: Zelphia CairoGretchen Adkins;  Location: [facility];  Service: Gynecology;;  . Cesarean section  05/08/2011    Procedure: CESAREAN SECTION;  Surgeon: Zelphia CairoGretchen Adkins;  Location: [facility];  Service: Gynecology;  Laterality: N/A;  Primary cesarean section with delivery of baby boy at 2055. Apgars 9/9. Cord blood x2 sent to nursery. Uterus massaged by S. Satterfield RN     Family History: Family History  Problem Relation Age of Onset  . Cancer Other     Breast, lung and prostate cancer    Social History: History  Substance Use Topics  . Smoking status: Never Smoker   . Smokeless tobacco: Never Used  . Alcohol Use: No    Allergies: No Known Allergies  Meds:  Prescriptions prior to admission  Medication Sig Dispense Refill  . lidocaine (XYLOCAINE) 2 % solution Use as directed 15 mLs in the mouth or throat every 4 (four) hours as needed  for mouth pain.  100 mL  0  . prenatal vitamin w/FE, FA (PRENATAL 1 + 1) 27-1 MG TABS Take 1 tablet by mouth daily.          ROS: Pertinent findings in history of present illness.  Mild, rare UC's. Having chills. Denies fever, wheezing, chest pain, palpitations, tachycardia, difficulty swallowing, swelling of lips or tongue. leakage of fluid or vaginal bleeding. Good fetal movement.    Physical Exam  Blood pressure 117/75, pulse 90, temperature 98.3 F (36.8 C), temperature source Oral, resp. rate 16, SpO2 98.00%. GENERAL: Well-developed, well-nourished female in no acute distress.  HEENT: normocephalic. No swelling of lips or tongue. Speaking w/out difficulty. Throat erythematous.  HEART: normal rate and rhythm. 1/6 systolic murmur. No Rubs or gallops. RESP: Mild increased WOB. Slight wheezing ABDOMEN: Soft, non-tender, gravid appropriate for gestational age EXTREMITIES: Nontender, no edema NEURO: alert and oriented SPECULUM EXAM: None    FHT:  Baseline 140 , moderate variability, accelerations present, no decelerations Contractions: Irreg, painless.    Labs: Results for orders placed during the hospital encounter of 02/18/14 (from the past 24 hour(s))  CBC     Status: Abnormal   Collection Time    02/18/14  6:33 AM  Result Value Ref Range   WBC 17.0 (*) 4.0 - 10.5 K/uL   RBC 3.65 (*) 3.87 - 5.11 MIL/uL   Hemoglobin 12.1  12.0 - 15.0 g/dL   HCT 16.134.4 (*) 09.636.0 - 04.546.0 %   MCV 94.2  78.0 - 100.0 fL   MCH 33.2  26.0 - 34.0 pg   MCHC 35.2  30.0 - 36.0 g/dL   RDW 40.912.5  81.111.5 - 91.415.5 %   Platelets 168  150 - 400 K/uL  DIFFERENTIAL     Status: Abnormal   Collection Time    02/18/14  6:33 AM      Result Value Ref Range   Neutrophils Relative % 74  43 - 77 %   Neutro Abs 12.2 (*) 1.7 - 7.7 K/uL   Lymphocytes Relative 15  12 - 46 %   Lymphs Abs 2.4  0.7 - 4.0 K/uL   Monocytes Relative 10  3 - 12 %   Monocytes Absolute 1.7 (*) 0.1 - 1.0 K/uL   Eosinophils Relative 1  0 - 5 %    Eosinophils Absolute 0.1  0.0 - 0.7 K/uL   Basophils Relative 1  0 - 1 %   Basophils Absolute 0.1  0.0 - 0.1 K/uL    Imaging:  Dg Chest 2 View  02/18/2014   CLINICAL DATA:  Shortness of breath.  Cough.  EXAM: CHEST  2 VIEW  COMPARISON:  None.  FINDINGS: The heart size is normal. The lungs are clear. The visualized soft tissues and bony thorax are unremarkable.  IMPRESSION: Negative two view chest.   Electronically Signed   By: Gennette Pachris  Mattern M.D.   On: 02/18/2014 08:00   MAU Course: Spontaneous improvement of Sx.   Assessment: 1. URI (upper respiratory infection)    Plan: Discharge home in stable condition. Preterm labor precautions and fetal kick counts. Increase fluids and rest.  Follow-up Information   Schedule an appointment as soon as possible for a visit with MORRIS, MEGAN, DO.   Specialty:  Obstetrics and Gynecology   Contact information:   [address], [address] n [address], [address] [place] [id number] [phone]        Medication List         azithromycin 250 MG tablet  Commonly known as:  ZITHROMAX  Take 500 mg by mouth daily.     lidocaine 2 % solution  Commonly known as:  XYLOCAINE  Use as directed 15 mLs in the mouth or throat every 4 (four) hours as needed for mouth pain.     prenatal vitamin w/FE, FA 27-1 MG Tabs tablet  Take 1 tablet by mouth daily.     promethazine-codeine 6.25-10 MG/5ML syrup  Commonly known as:  PHENERGAN with CODEINE  Take 7.5 mLs by mouth every 6 (six) hours as needed for cough.        [place]ia Tawonna Esquer, [place] 02/18/2014 8:34 AM

## 2014-02-18 NOTE — MAU Note (Signed)
Pt states has had sore throat and was seen in office, started on Z Pack yesterday. Difficulty breathing since 9pm, worse when lying down; feels like can't get deep breath. Kept waking up tonight "feeling like I was drowning". Sore throat has improved since ZPack. Denies vaginal bleeding, LOF. Positive fetal movement. Some irregular contractions, not painful.

## 2014-02-18 NOTE — Discharge Instructions (Signed)

## 2014-03-15 NOTE — H&P (Addendum)
Sydney Stewart is a 33 y.o. female presenting for repeat c-section.  Pregnancy uncomplicated.  History OB History   Grav Para Term Preterm Abortions TAB SAB Ect Mult Living   2 1 1  0 0 0 0 0 0 1     Past Medical History  Diagnosis Date  . 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 S/p iud    . Cesarean section  05/08/2011    Procedure: CESAREAN SECTION;  Surgeon: Zelphia CairoGretchen Shaquilla Kehres;  Location: [facility];  Service: Gynecology;;  . Cesarean section  05/08/2011    Procedure: CESAREAN SECTION;  Surgeon: Zelphia CairoGretchen Mete Purdum;  Location: [facility];  Service: Gynecology;  Laterality: N/A;  Primary cesarean section with delivery of baby boy at 2055. Apgars 9/9. Cord blood x2 sent to nursery. Uterus massaged by S. Satterfield RN   Family History: family history includes Cancer in her other. Social History:  reports that she has never smoked. She has never used smokeless tobacco. She reports that she does not drink alcohol or use illicit drugs.   Prenatal Transfer Tool  Maternal Diabetes: No Genetic Screening: Declined Maternal Ultrasounds/Referrals: Normal Fetal Ultrasounds or other Referrals:  None Maternal Substance Abuse:  No Significant Maternal Medications:  None Significant Maternal Lab Results:  None Other Comments:  None  ROS    There were no vitals taken for this visit. Exam Physical Exam  Gen - NAD Abd - gravid, NT CV - RRR Lungs - clear Ext - NT PV - deferred Prenatal labs: ABO, Rh:   Antibody:   Rubella:   RPR:    HBsAg:    HIV:    GBS:     Assessment/Plan: Previous c-section, desires rpt R/b/a discussed, questions answered, informed consent   Zelphia CairoGretchen Lashae Wollenberg 03/15/2014, 1:45 PM

## 2014-03-16 ENCOUNTER — Encounter (HOSPITAL_COMMUNITY): Payer: Self-pay

## 2014-03-16 ENCOUNTER — Encounter (HOSPITAL_COMMUNITY)
Admission: RE | Admit: 2014-03-16 | Discharge: 2014-03-16 | Disposition: A | Discharge status: Home / Self Care | Payer: Managed Care, Other (non HMO) | Source: Ambulatory Visit | Attending: Obstetrics and Gynecology | Admitting: Obstetrics and Gynecology

## 2014-03-16 ENCOUNTER — Encounter (HOSPITAL_COMMUNITY): Payer: Self-pay | Admitting: Pharmacist

## 2014-03-16 HISTORY — DX: Other specified health status: Z78.9

## 2014-03-16 LAB — CBC
HCT: 35.9 % — ABNORMAL LOW (ref 36.0–46.0)
Hemoglobin: 12.6 g/dL (ref 12.0–15.0)
MCH: 32.8 pg (ref 26.0–34.0)
MCHC: 35.1 g/dL (ref 30.0–36.0)
MCV: 93.5 fL (ref 78.0–100.0)
PLATELETS: 175 10*3/uL (ref 150–400)
RBC: 3.84 MIL/uL — ABNORMAL LOW (ref 3.87–5.11)
RDW: 12.5 % (ref 11.5–15.5)
WBC: 12.5 10*3/uL — ABNORMAL HIGH (ref 4.0–10.5)

## 2014-03-16 LAB — RPR

## 2014-03-16 LAB — TYPE AND SCREEN
ABO/RH(D): A POS
Antibody Screen: NEGATIVE

## 2014-03-16 LAB — ABO/RH: ABO/RH(D): A POS

## 2014-03-16 NOTE — Patient Instructions (Signed)
20 Sydney Stewart  03/16/2014   Your procedure is scheduled on:  03/18/14  [facility] AM.  Pick up the phone at the desk and dial [phone].   Call this number if you have problems the morning of surgery: [phone]   Remember:   Do not eat food:After Midnight.  Do not drink clear liquids: 4 Hours before arrival.  Take these medicines the morning of surgery with A SIP OF WATER: NA   Do not wear jewelry, make-up or nail polish.  Do not wear lotions, powders, or perfumes. You may wear deodorant.  Do not shave 48 hours prior to surgery.  Do not bring valuables to the hospital.  [facility] is not   responsible for any belongings or valuables brought to the hospital.  Contacts, dentures or bridgework may not be worn into surgery.  Leave suitcase in the car. After surgery it may be brought to your room.  For patients admitted to the hospital, checkout time is 11:00 AM the day of              discharge.   Patients discharged the day of surgery will not be allowed to drive             home.  Name and phone number of your driver: NA  Special Instructions:      Please read over the following fact sheets that you were given:

## 2014-03-18 ENCOUNTER — Encounter (HOSPITAL_COMMUNITY): Payer: Self-pay | Admitting: *Deleted

## 2014-03-18 ENCOUNTER — Encounter (HOSPITAL_COMMUNITY)
Admission: RE | Disposition: A | Discharge status: Home / Self Care | Payer: Self-pay | Source: Ambulatory Visit | Attending: Obstetrics and Gynecology

## 2014-03-18 ENCOUNTER — Inpatient Hospital Stay (HOSPITAL_COMMUNITY): Payer: Managed Care, Other (non HMO) | Admitting: Anesthesiology

## 2014-03-18 ENCOUNTER — Encounter (HOSPITAL_COMMUNITY): Payer: Managed Care, Other (non HMO) | Admitting: Anesthesiology

## 2014-03-18 ENCOUNTER — Inpatient Hospital Stay (HOSPITAL_COMMUNITY)
Admission: RE | Admit: 2014-03-18 | Discharge: 2014-03-20 | DRG: 766 | Disposition: A | Discharge status: Home / Self Care | Payer: Managed Care, Other (non HMO) | Source: Ambulatory Visit | Attending: Obstetrics and Gynecology | Admitting: Obstetrics and Gynecology

## 2014-03-18 DIAGNOSIS — Z98891 History of uterine scar from previous surgery: Secondary | ICD-10-CM

## 2014-03-18 DIAGNOSIS — O34219 Maternal care for unspecified type scar from previous cesarean delivery: Principal | ICD-10-CM | POA: Diagnosis present

## 2014-03-18 SURGERY — Surgical Case
Anesthesia: Spinal | Site: Abdomen

## 2014-03-18 MED ORDER — LACTATED RINGERS IV SOLN
Freq: Once | INTRAVENOUS | Status: AC
  Administered 2014-03-18 (×3): via INTRAVENOUS

## 2014-03-18 MED ORDER — KETOROLAC TROMETHAMINE 60 MG/2ML IM SOLN
INTRAMUSCULAR | Status: AC
Start: 2014-03-18 — End: 2014-03-18
  Administered 2014-03-18: 60 mg via INTRAMUSCULAR
  Filled 2014-03-18: qty 2

## 2014-03-18 MED ORDER — MORPHINE SULFATE (PF) 0.5 MG/ML IJ SOLN
INTRAMUSCULAR | Status: DC | PRN
  Administered 2014-03-18: .15 mg via INTRATHECAL

## 2014-03-18 MED ORDER — OXYCODONE-ACETAMINOPHEN 5-325 MG PO TABS: 1.0000 | ORAL_TABLET | ORAL | Status: DC | PRN

## 2014-03-18 MED ORDER — FENTANYL CITRATE 0.05 MG/ML IJ SOLN: 25.0000 ug | INTRAMUSCULAR | Status: DC | PRN

## 2014-03-18 MED ORDER — METOCLOPRAMIDE HCL 5 MG/ML IJ SOLN: 10.0000 mg | Freq: Once | INTRAMUSCULAR | Status: DC | PRN

## 2014-03-18 MED ORDER — SIMETHICONE 80 MG PO CHEW: 80.0000 mg | CHEWABLE_TABLET | ORAL | Status: DC | PRN

## 2014-03-18 MED ORDER — NALOXONE HCL 0.4 MG/ML IJ SOLN: 0.4000 mg | INTRAMUSCULAR | Status: DC | PRN

## 2014-03-18 MED ORDER — LANOLIN HYDROUS EX OINT
1.0000 | TOPICAL_OINTMENT | CUTANEOUS | Status: DC | PRN
Start: 2014-03-18 — End: 2014-03-20

## 2014-03-18 MED ORDER — MORPHINE SULFATE 0.5 MG/ML IJ SOLN
INTRAMUSCULAR | Status: AC
  Filled 2014-03-18: qty 10

## 2014-03-18 MED ORDER — SENNOSIDES-DOCUSATE SODIUM 8.6-50 MG PO TABS
2.0000 | ORAL_TABLET | ORAL | Status: DC
  Administered 2014-03-18 – 2014-03-20 (×2): 2 via ORAL
  Filled 2014-03-18 (×2): qty 2

## 2014-03-18 MED ORDER — MEPERIDINE HCL 25 MG/ML IJ SOLN: 6.2500 mg | INTRAMUSCULAR | Status: DC | PRN

## 2014-03-18 MED ORDER — ONDANSETRON HCL 4 MG/2ML IJ SOLN
INTRAMUSCULAR | Status: AC
  Filled 2014-03-18: qty 2

## 2014-03-18 MED ORDER — DIPHENHYDRAMINE HCL 25 MG PO CAPS
25.0000 mg | ORAL_CAPSULE | Freq: Four times a day (QID) | ORAL | Status: DC | PRN
  Filled 2014-03-18: qty 1

## 2014-03-18 MED ORDER — NALBUPHINE HCL 10 MG/ML IJ SOLN
5.0000 mg | INTRAMUSCULAR | Status: DC | PRN
  Administered 2014-03-18 (×2): 10 mg via INTRAVENOUS
  Filled 2014-03-18 (×2): qty 1

## 2014-03-18 MED ORDER — DIPHENHYDRAMINE HCL 50 MG/ML IJ SOLN: 25.0000 mg | INTRAMUSCULAR | Status: DC | PRN

## 2014-03-18 MED ORDER — CEFAZOLIN SODIUM-DEXTROSE 2-3 GM-% IV SOLR
INTRAVENOUS | Status: AC
  Filled 2014-03-18: qty 50

## 2014-03-18 MED ORDER — WITCH HAZEL-GLYCERIN EX PADS: 1.0000 "application " | MEDICATED_PAD | CUTANEOUS | Status: DC | PRN

## 2014-03-18 MED ORDER — SODIUM CHLORIDE 0.9 % IJ SOLN: 3.0000 mL | INTRAMUSCULAR | Status: DC | PRN

## 2014-03-18 MED ORDER — DEXTROSE IN LACTATED RINGERS 5 % IV SOLN
INTRAVENOUS | Status: DC
  Administered 2014-03-18: 17:00:00 via INTRAVENOUS

## 2014-03-18 MED ORDER — TETANUS-DIPHTH-ACELL PERTUSSIS 5-2.5-18.5 LF-MCG/0.5 IM SUSP
0.5000 mL | Freq: Once | INTRAMUSCULAR | Status: AC
  Administered 2014-03-19: 0.5 mL via INTRAMUSCULAR
  Filled 2014-03-18: qty 0.5

## 2014-03-18 MED ORDER — DIPHENHYDRAMINE HCL 50 MG/ML IJ SOLN: 12.5000 mg | INTRAMUSCULAR | Status: DC | PRN

## 2014-03-18 MED ORDER — MENTHOL 3 MG MT LOZG: 1.0000 | LOZENGE | OROMUCOSAL | Status: DC | PRN

## 2014-03-18 MED ORDER — ONDANSETRON HCL 4 MG/2ML IJ SOLN: 4.0000 mg | Freq: Three times a day (TID) | INTRAMUSCULAR | Status: DC | PRN

## 2014-03-18 MED ORDER — MEASLES, MUMPS & RUBELLA VAC ~~LOC~~ INJ
0.5000 mL | INJECTION | Freq: Once | SUBCUTANEOUS | Status: AC
  Administered 2014-03-20: 0.5 mL via SUBCUTANEOUS
  Filled 2014-03-18: qty 0.5

## 2014-03-18 MED ORDER — ONDANSETRON HCL 4 MG/2ML IJ SOLN
INTRAMUSCULAR | Status: DC | PRN
  Administered 2014-03-18: 4 mg via INTRAVENOUS

## 2014-03-18 MED ORDER — FENTANYL CITRATE 0.05 MG/ML IJ SOLN
INTRAMUSCULAR | Status: DC | PRN
  Administered 2014-03-18: 25 ug via INTRATHECAL

## 2014-03-18 MED ORDER — SIMETHICONE 80 MG PO CHEW
80.0000 mg | CHEWABLE_TABLET | ORAL | Status: DC
  Administered 2014-03-18 – 2014-03-20 (×2): 80 mg via ORAL
  Filled 2014-03-18 (×2): qty 1

## 2014-03-18 MED ORDER — KETOROLAC TROMETHAMINE 30 MG/ML IJ SOLN: 30.0000 mg | Freq: Four times a day (QID) | INTRAMUSCULAR | Status: AC | PRN

## 2014-03-18 MED ORDER — PRENATAL MULTIVITAMIN CH
1.0000 | ORAL_TABLET | Freq: Every day | ORAL | Status: DC
  Administered 2014-03-19: 1 via ORAL
  Filled 2014-03-18: qty 1

## 2014-03-18 MED ORDER — OXYTOCIN 10 UNIT/ML IJ SOLN
40.0000 [IU] | INTRAVENOUS | Status: DC | PRN
  Administered 2014-03-18: 40 [IU] via INTRAVENOUS

## 2014-03-18 MED ORDER — CEFAZOLIN SODIUM-DEXTROSE 2-3 GM-% IV SOLR
2.0000 g | INTRAVENOUS | Status: AC
  Administered 2014-03-18: 2 g via INTRAVENOUS

## 2014-03-18 MED ORDER — PHENYLEPHRINE 8 MG IN D5W 100 ML (0.08MG/ML) PREMIX OPTIME
INJECTION | INTRAVENOUS | Status: DC | PRN
  Administered 2014-03-18: 60 ug/min via INTRAVENOUS

## 2014-03-18 MED ORDER — FENTANYL CITRATE 0.05 MG/ML IJ SOLN
INTRAMUSCULAR | Status: AC
  Filled 2014-03-18: qty 2

## 2014-03-18 MED ORDER — OXYTOCIN 10 UNIT/ML IJ SOLN
INTRAMUSCULAR | Status: AC
Start: 2014-03-18 — End: 2014-03-18
  Filled 2014-03-18: qty 4

## 2014-03-18 MED ORDER — SCOPOLAMINE 1 MG/3DAYS TD PT72
1.0000 | MEDICATED_PATCH | Freq: Once | TRANSDERMAL | Status: DC
  Administered 2014-03-18: 1.5 mg via TRANSDERMAL

## 2014-03-18 MED ORDER — DEXTROSE 5 % IV SOLN
1.0000 ug/kg/h | INTRAVENOUS | Status: DC | PRN
  Filled 2014-03-18: qty 2

## 2014-03-18 MED ORDER — OXYTOCIN 40 UNITS IN LACTATED RINGERS INFUSION - SIMPLE MED: 62.5000 mL/h | INTRAVENOUS | Status: AC

## 2014-03-18 MED ORDER — DIBUCAINE 1 % RE OINT: 1.0000 "application " | TOPICAL_OINTMENT | RECTAL | Status: DC | PRN

## 2014-03-18 MED ORDER — BUPIVACAINE IN DEXTROSE 0.75-8.25 % IT SOLN
INTRATHECAL | Status: DC | PRN
  Administered 2014-03-18: 1.6 mL via INTRATHECAL

## 2014-03-18 MED ORDER — METOCLOPRAMIDE HCL 5 MG/ML IJ SOLN: 10.0000 mg | Freq: Three times a day (TID) | INTRAMUSCULAR | Status: DC | PRN

## 2014-03-18 MED ORDER — NALBUPHINE HCL 10 MG/ML IJ SOLN
INTRAMUSCULAR | Status: AC
  Administered 2014-03-18: 10 mg via SUBCUTANEOUS
  Filled 2014-03-18: qty 1

## 2014-03-18 MED ORDER — IBUPROFEN 600 MG PO TABS
600.0000 mg | ORAL_TABLET | Freq: Four times a day (QID) | ORAL | Status: DC
  Administered 2014-03-18 – 2014-03-20 (×6): 600 mg via ORAL
  Filled 2014-03-18 (×6): qty 1

## 2014-03-18 MED ORDER — NALBUPHINE HCL 10 MG/ML IJ SOLN
5.0000 mg | INTRAMUSCULAR | Status: DC | PRN
  Administered 2014-03-18: 10 mg via SUBCUTANEOUS

## 2014-03-18 MED ORDER — ONDANSETRON HCL 4 MG/2ML IJ SOLN: 4.0000 mg | INTRAMUSCULAR | Status: DC | PRN

## 2014-03-18 MED ORDER — DIPHENHYDRAMINE HCL 25 MG PO CAPS
25.0000 mg | ORAL_CAPSULE | ORAL | Status: DC | PRN
Start: 2014-03-18 — End: 2014-03-20
  Administered 2014-03-19: 25 mg via ORAL

## 2014-03-18 MED ORDER — SCOPOLAMINE 1 MG/3DAYS TD PT72
MEDICATED_PATCH | TRANSDERMAL | Status: AC
  Administered 2014-03-18: 1.5 mg via TRANSDERMAL
  Filled 2014-03-18: qty 1

## 2014-03-18 MED ORDER — SCOPOLAMINE 1 MG/3DAYS TD PT72
1.0000 | MEDICATED_PATCH | Freq: Once | TRANSDERMAL | Status: DC
  Filled 2014-03-18: qty 1

## 2014-03-18 MED ORDER — SIMETHICONE 80 MG PO CHEW
80.0000 mg | CHEWABLE_TABLET | Freq: Three times a day (TID) | ORAL | Status: DC
  Administered 2014-03-19 – 2014-03-20 (×3): 80 mg via ORAL
  Filled 2014-03-18 (×4): qty 1

## 2014-03-18 MED ORDER — KETOROLAC TROMETHAMINE 60 MG/2ML IM SOLN
60.0000 mg | Freq: Once | INTRAMUSCULAR | Status: AC | PRN
  Administered 2014-03-18: 60 mg via INTRAMUSCULAR

## 2014-03-18 MED ORDER — MEDROXYPROGESTERONE ACETATE 150 MG/ML IM SUSP: 150.0000 mg | INTRAMUSCULAR | Status: DC | PRN

## 2014-03-18 MED ORDER — ONDANSETRON HCL 4 MG PO TABS: 4.0000 mg | ORAL_TABLET | ORAL | Status: DC | PRN

## 2014-03-18 SURGICAL SUPPLY — 36 items
ADH SKN CLS APL DERMABOND .7 (GAUZE/BANDAGES/DRESSINGS) ×1
ADH SKN CLS LQ APL DERMABOND (GAUZE/BANDAGES/DRESSINGS) ×1
CLAMP CORD UMBIL (MISCELLANEOUS) ×2 IMPLANT
CLOTH BEACON ORANGE TIMEOUT ST (SAFETY) ×3 IMPLANT
DERMABOND ADHESIVE PROPEN (GAUZE/BANDAGES/DRESSINGS) ×2
DERMABOND ADVANCED (GAUZE/BANDAGES/DRESSINGS) ×2
DERMABOND ADVANCED .7 DNX12 (GAUZE/BANDAGES/DRESSINGS) ×1 IMPLANT
DERMABOND ADVANCED .7 DNX6 (GAUZE/BANDAGES/DRESSINGS) IMPLANT
DRAPE LG THREE QUARTER DISP (DRAPES) ×2 IMPLANT
DRSG OPSITE POSTOP 4X10 (GAUZE/BANDAGES/DRESSINGS) ×3 IMPLANT
DURAPREP 26ML APPLICATOR (WOUND CARE) ×3 IMPLANT
ELECT REM PT RETURN 9FT ADLT (ELECTROSURGICAL) ×3
ELECTRODE REM PT RTRN 9FT ADLT (ELECTROSURGICAL) ×1 IMPLANT
EXTRACTOR VACUUM M CUP 4 TUBE (SUCTIONS) IMPLANT
EXTRACTOR VACUUM M CUP 4' TUBE (SUCTIONS)
GLOVE BIO SURGEON STRL SZ 6.5 (GLOVE) ×2 IMPLANT
GLOVE BIO SURGEONS STRL SZ 6.5 (GLOVE) ×1
GLOVE BIOGEL PI IND STRL 7.0 (GLOVE) ×1 IMPLANT
GLOVE BIOGEL PI INDICATOR 7.0 (GLOVE) ×2
GOWN STRL REUS W/TWL LRG LVL3 (GOWN DISPOSABLE) ×6 IMPLANT
KIT ABG SYR 3ML LUER SLIP (SYRINGE) IMPLANT
NDL HYPO 25X5/8 SAFETYGLIDE (NEEDLE) IMPLANT
NEEDLE HYPO 25X5/8 SAFETYGLIDE (NEEDLE) IMPLANT
NS IRRIG 1000ML POUR BTL (IV SOLUTION) ×3 IMPLANT
PACK C SECTION WH (CUSTOM PROCEDURE TRAY) ×3 IMPLANT
PAD OB MATERNITY 4.3X12.25 (PERSONAL CARE ITEMS) ×3 IMPLANT
STAPLER VISISTAT 35W (STAPLE) IMPLANT
SUT CHROMIC 0 CT 802H (SUTURE) IMPLANT
SUT CHROMIC 0 CTX 36 (SUTURE) ×7 IMPLANT
SUT MON AB-0 CT1 36 (SUTURE) ×3 IMPLANT
SUT PDS AB 0 CTX 60 (SUTURE) ×3 IMPLANT
SUT PLAIN 0 NONE (SUTURE) IMPLANT
SUT VIC AB 4-0 KS 27 (SUTURE) ×2 IMPLANT
TOWEL OR 17X24 6PK STRL BLUE (TOWEL DISPOSABLE) ×3 IMPLANT
TRAY FOLEY CATH 14FR (SET/KITS/TRAYS/PACK) ×2 IMPLANT
WATER STERILE IRR 1000ML POUR (IV SOLUTION) ×3 IMPLANT

## 2014-03-18 NOTE — Anesthesia Postprocedure Evaluation (Signed)
  Anesthesia Post Note  Patient: Sydney Stewart  Procedure(s) Performed: Procedure(s) (LRB): CESAREAN SECTION (N/A)  Anesthesia type: Spinal  Patient location: PACU  Post pain: Pain level controlled  Post assessment: Post-op Vital signs reviewed  Last Vitals:  Filed Vitals:   03/18/14 1445  BP: 99/55  Pulse: 64  Temp:   Resp: 18    Post vital signs: Reviewed  Level of consciousness: awake  Complications: No apparent anesthesia complications

## 2014-03-18 NOTE — Transfer of Care (Signed)
Immediate Anesthesia Transfer of Care Note  Patient: Sydney Stewart  Procedure(s) Performed: Procedure(s) with comments: [facility] SE[facility] (N/A) [facility]  [facility] Location: PACU  Anesthesia Type:Spinal  Level of Consciousness: awake  Airway & Oxygen Therapy: Patient Spontanous Breathing  Post-op Assessment: Report given to PACU RN  Post vital signs: Reviewed and stable  Complications: No apparent anesthesia complications

## 2014-03-18 NOTE — Anesthesia Procedure Notes (Signed)
Spinal  Patient location during procedure: OR Start time: 03/18/2014 1:00 PM Staffing Anesthesiologist: Brayton CavesJACKSON, Nuriyah Hanline Performed by: anesthesiologist  Preanesthetic Checklist Completed: patient identified, site marked, surgical consent, pre-op evaluation, timeout performed, IV checked, risks and benefits discussed and monitors and equipment checked Spinal Block Patient position: sitting Prep: DuraPrep Patient monitoring: heart rate, cardiac monitor, continuous pulse ox and blood pressure Approach: midline Location: L3-4 Injection technique: single-shot Needle Needle type: Sprotte  Needle gauge: 24 G Needle length: 9 cm Assessment Sensory level: T4 Additional Notes Patient identified.  Risk benefits discussed including failed block, incomplete pain control, headache, nerve damage, paralysis, blood pressure changes, nausea, vomiting, reactions to medication both toxic or allergic, and postpartum back pain.  Patient expressed understanding and wished to proceed.  All questions were answered.  Sterile technique used throughout procedure and epidural site dressed with sterile barrier dressing. No paresthesia or other complications noted.The patient did not experience any signs of intravascular injection such as tinnitus or metallic taste in mouth nor signs of intrathecal spread such as rapid motor block. Please see nursing notes for vital signs.

## 2014-03-18 NOTE — Anesthesia Preprocedure Evaluation (Signed)
Anesthesia Evaluation  Patient identified by MRN, date of birth, ID band Patient awake    Reviewed: Allergy & Precautions, H&P , NPO status , Patient's Chart, lab work & pertinent test results, reviewed documented beta blocker date and time   History of Anesthesia Complications Negative for: history of anesthetic complications  Airway Mallampati: II TM Distance: >3 FB Neck ROM: full    Dental  (+) Teeth Intact   Pulmonary neg pulmonary ROS,  breath sounds clear to auscultation        Cardiovascular negative cardio ROS  Rhythm:regular Rate:Normal     Neuro/Psych negative neurological ROS  negative psych ROS   GI/Hepatic negative GI ROS, Neg liver ROS,   Endo/Other  negative endocrine ROS  Renal/GU negative Renal ROS     Musculoskeletal   Abdominal   Peds  Hematology negative hematology ROS (+)   Anesthesia Other Findings   Reproductive/Obstetrics (+) Pregnancy (h/o c/s x1, for repeat)                           Anesthesia Physical Anesthesia Plan  ASA: II  Anesthesia Plan: Spinal   Post-op Pain Management:    Induction:   Airway Management Planned:   Additional Equipment:   Intra-op Plan:   Post-operative Plan:   Informed Consent: I have reviewed the patients History and Physical, chart, labs and discussed the procedure including the risks, benefits and alternatives for the proposed anesthesia with the patient or authorized representative who has indicated his/her understanding and acceptance.     Plan Discussed with: CRNA and Surgeon  Anesthesia Plan Comments:         Anesthesia Quick Evaluation

## 2014-03-18 NOTE — Op Note (Signed)
Cesarean Section Procedure Note   Sydney Stewart  03/18/2014  Indications: Scheduled Proceedure/Maternal Request   Pre-operative Diagnosis: Previous Cesarean.   Post-operative Diagnosis: Same   Surgeon: Surgeon(s) and Role:    * Zelphia CairoGretchen Stevon Gough, MD - Primary   Assistants: none  Anesthesia: spinal   Procedure Details:  The patient was seen in the Holding Room. The risks, benefits, complications, treatment options, and expected outcomes were discussed with the patient. The patient concurred with the proposed plan, giving informed consent. identified as Sydney ButcherAshlie Kartes and the procedure verified as C-Section Delivery. A Time Out was held and the above information confirmed.  After induction of anesthesia, the patient was draped and prepped in the usual sterile manner. A transverse was made and carried down through the subcutaneous tissue to the fascia. Fascial incision was made and extended transversely. The fascia was separated from the underlying rectus tissue superiorly and inferiorly. The peritoneum was identified and entered. Peritoneal incision was extended longitudinally. The utero-vesical peritoneal reflection was incised transversely and the bladder flap was bluntly freed from the lower uterine segment. A low transverse uterine incision was made. Delivered from cephalic presentation was a vigerous female with Apgar scores of 9 at one minute and 9 at five minutes. Cord ph was not sent the umbilical cord was clamped and cut cord blood was obtained for evaluation. The placenta was removed Intact and appeared normal. The uterine outline, tubes and ovaries appeared normal}. The uterine incision was closed with running locked sutures of 0chromic gut.   Hemostasis was observed. Lavage was carried out until clear. The fascia was then reapproximated with running sutures of 0PDS.  The skin was closed with 4-0Vicryl.   Instrument, sponge, and needle counts were correct prior the abdominal closure and were  correct at the conclusion of the case.     Estimated Blood Loss: cc    Urine Output: clear  Specimens: @ORSPECIMEN @   Complications: no complications  Disposition: PACU - hemodynamically stable.   Maternal Condition: stable   Baby condition / location:  Couplet care / Skin to Skin  Attending Attestation: I was present and scrubbed for the entire procedure.   Signed: Surgeon(s): Zelphia CairoGretchen Ireanna Finlayson, MD

## 2014-03-19 ENCOUNTER — Encounter (HOSPITAL_COMMUNITY): Payer: Self-pay | Admitting: Obstetrics and Gynecology

## 2014-03-19 LAB — CBC
HCT: 33 % — ABNORMAL LOW (ref 36.0–46.0)
Hemoglobin: 11.4 g/dL — ABNORMAL LOW (ref 12.0–15.0)
MCH: 32.6 pg (ref 26.0–34.0)
MCHC: 34.5 g/dL (ref 30.0–36.0)
MCV: 94.3 fL (ref 78.0–100.0)
PLATELETS: 146 10*3/uL — AB (ref 150–400)
RBC: 3.5 MIL/uL — AB (ref 3.87–5.11)
RDW: 12.6 % (ref 11.5–15.5)
WBC: 17.1 10*3/uL — ABNORMAL HIGH (ref 4.0–10.5)

## 2014-03-19 LAB — BIRTH TISSUE RECOVERY COLLECTION (PLACENTA DONATION)

## 2014-03-19 NOTE — Progress Notes (Signed)
Subjective: Postpartum Day 1: Cesarean Delivery Patient reports tolerating PO, + flatus and no problems voiding.    Objective: Vital signs in last 24 hours: Temp:  [97.5 F (36.4 C)-98.4 F (36.9 C)] 98.4 F (36.9 C) (05/22 0730) Pulse Rate:  [55-88] 61 (05/22 0730) Resp:  [16-28] 18 (05/22 0730) BP: (90-112)/(50-77) 90/51 mmHg (05/22 0730) SpO2:  [94 %-98 %] 98 % (05/22 0730) Weight:  [160 lb 15 oz (73 kg)] 160 lb 15 oz (73 kg) (05/21 1520)  Physical Exam:  General: alert and cooperative Lochia: appropriate Uterine Fundus: firm Incision: healing well DVT Evaluation: No evidence of DVT seen on physical exam. Negative Homan's sign. No cords or calf tenderness. No significant calf/ankle edema.   Recent Labs  03/16/14 1509 03/19/14 0555  HGB 12.6 11.4*  HCT 35.9* 33.0*    Assessment/Plan: Status post Cesarean section. Doing well postoperatively.  Continue current care.  Judith Blonder 03/19/2014, 8:24 AM

## 2014-03-19 NOTE — Anesthesia Postprocedure Evaluation (Signed)
  Anesthesia Post-op Note  Patient: Sydney Stewart  Procedure(s) Performed: Procedure(s) with comments: CESAREAN SECTION (N/A) - REPEAT EDC 5/27  Patient Location: Mother/Baby  Anesthesia Type:Epidural  Level of Consciousness: awake  Airway and Oxygen Therapy: Patient Spontanous Breathing  Post-op Pain: none  Post-op Assessment: Patient's Cardiovascular Status Stable, Respiratory Function Stable, Patent Airway, No signs of Nausea or vomiting, Adequate PO intake, Pain level controlled, No headache, No backache, No residual numbness and No residual motor weakness  Post-op Vital Signs: Reviewed and stable  Last Vitals:  Filed Vitals:   03/19/14 0330  BP: 99/55  Pulse: 59  Temp: 36.8 C  Resp: 20    Complications: No apparent anesthesia complications

## 2014-03-19 NOTE — Addendum Note (Signed)
Addendum created 03/19/14 0815 by Suella Grove, CRNA   Modules edited: Notes Section   Notes Section:  File: [id number]

## 2014-03-20 MED ORDER — IBUPROFEN 600 MG PO TABS: 600.0000 mg | ORAL_TABLET | Freq: Four times a day (QID) | ORAL | Status: DC | PRN

## 2014-03-20 MED ORDER — OXYCODONE-ACETAMINOPHEN 5-325 MG PO TABS: 1.0000 | ORAL_TABLET | Freq: Four times a day (QID) | ORAL | Status: DC | PRN

## 2014-03-20 NOTE — Progress Notes (Signed)
Subjective: Postpartum Day 2: Cesarean Delivery Patient reports tolerating PO, + flatus and no problems voiding.    Objective: Vital signs in last 24 hours: Temp:  [97.7 F (36.5 C)-98.4 F (36.9 C)] 97.7 F (36.5 C) (05/22 1840) Pulse Rate:  [58-85] 64 (05/22 1840) Resp:  [18] 18 (05/22 1351) BP: (90-104)/(51-67) 104/62 mmHg (05/22 1840) SpO2:  [98 %] 98 % (05/22 1351)  Physical Exam:  General: alert, cooperative and no distress Lochia: appropriate Uterine Fundus: firm Incision: healing well DVT Evaluation: No evidence of DVT seen on physical exam.   Recent Labs  03/19/14 0555  HGB 11.4*  HCT 33.0*    Assessment/Plan: Status post Cesarean section. Doing well postoperatively.  Discharge home with standard precautions and return to clinic in 4-6 weeks.  [name] 03/20/2014, 6:32 AM

## 2014-03-20 NOTE — Discharge Summary (Signed)
Obstetric Discharge Summary Reason for Admission: cesarean section Prenatal Procedures: ultrasound Intrapartum Procedures: cesarean: low cervical, transverse Postpartum Procedures: none Complications-Operative and Postpartum: none Hemoglobin  Date Value Ref Range Status  03/19/2014 11.4* 12.0 - 15.0 g/dL Final     HCT  Date Value Ref Range Status  03/19/2014 33.0* 36.0 - 46.0 % Final    Physical Exam:  General: alert, cooperative and no distress Lochia: appropriate Uterine Fundus: firm Incision: healing well DVT Evaluation: No evidence of DVT seen on physical exam.  Discharge Diagnoses: Term Pregnancy-delivered  Discharge Information: Date: 03/20/2014 Activity: pelvic rest Diet: routine Medications: PNV, Ibuprofen and Percocet Condition: stable Instructions: refer to practice specific booklet Discharge to: home   Newborn Data: Live born female  Birth Weight: 9 lb 6.6 oz (4270 g) APGAR: 9, 9  Home with mother.  Sydney Stewart II 03/20/2014, 6:34 AM

## 2014-03-20 NOTE — Lactation Note (Addendum)
This note was copied from the chart of Sydney Stewart. Lactation Consultation Note    Follow up consult with this mom and baby, now 43 hours post partum. Mom had just breast fed, and baby asleep with mom. Baby is at %8 weight los, bu has had lots of wet and 3 stools, and mom reports breast feeding going well, and comfortable.On exam, mom has easily expressed colostrum/milk. Breast care/engorgement reviewed, as well as how many et and dirty diapers to look for this weekend, Pediatrician appointment for Monday. Mom knows to call lactation for questions/concerns.  Patient Name: Sydney Jammi Dacruz [id number] Date: 03/20/2014 Reason for consult: Follow-up assessment   Maternal Data    Feeding Feeding Type: Breast Fed Length of feed: 10 min  LATCH Score/Interventions Latch: Grasps breast easily, tongue down, lips flanged, rhythmical sucking.  Audible Swallowing: A few with stimulation  Type of Nipple: Everted at rest and after stimulation  Comfort (Breast/Nipple): Soft / non-tender     Hold (Positioning): Assistance needed to correctly position infant at breast and maintain latch.  LATCH Score: 8  Lactation Tools Discussed/Used     Consult Status Consult Status: Complete Follow-up type: Call as needed    Alfred Levins 03/20/2014, 9:04 AM

## 2014-05-18 ENCOUNTER — Other Ambulatory Visit: Payer: Self-pay | Admitting: Dermatology

## 2014-05-25 ENCOUNTER — Ambulatory Visit: Payer: Managed Care, Other (non HMO) | Admitting: Internal Medicine

## 2014-05-25 DIAGNOSIS — Z0289 Encounter for other administrative examinations: Secondary | ICD-10-CM

## 2014-06-02 ENCOUNTER — Telehealth: Payer: Self-pay | Admitting: Internal Medicine

## 2014-06-02 NOTE — Telephone Encounter (Signed)
Patient no showed for med fu on 07/28.  Please advise.

## 2014-06-02 NOTE — Telephone Encounter (Signed)
Ok to offer ROV next available 

## 2014-06-02 NOTE — Telephone Encounter (Signed)
Left patient vm to call back to reschedule  °

## 2014-06-04 IMAGING — CR DG CHEST 2V
2 series · 2 of 2 positions shown · non-contrast
Comparison: None.

CLINICAL DATA: Shortness of breath.  Cough.

EXAM:
CHEST  2 VIEW

[view not recorded (1 of 2)]
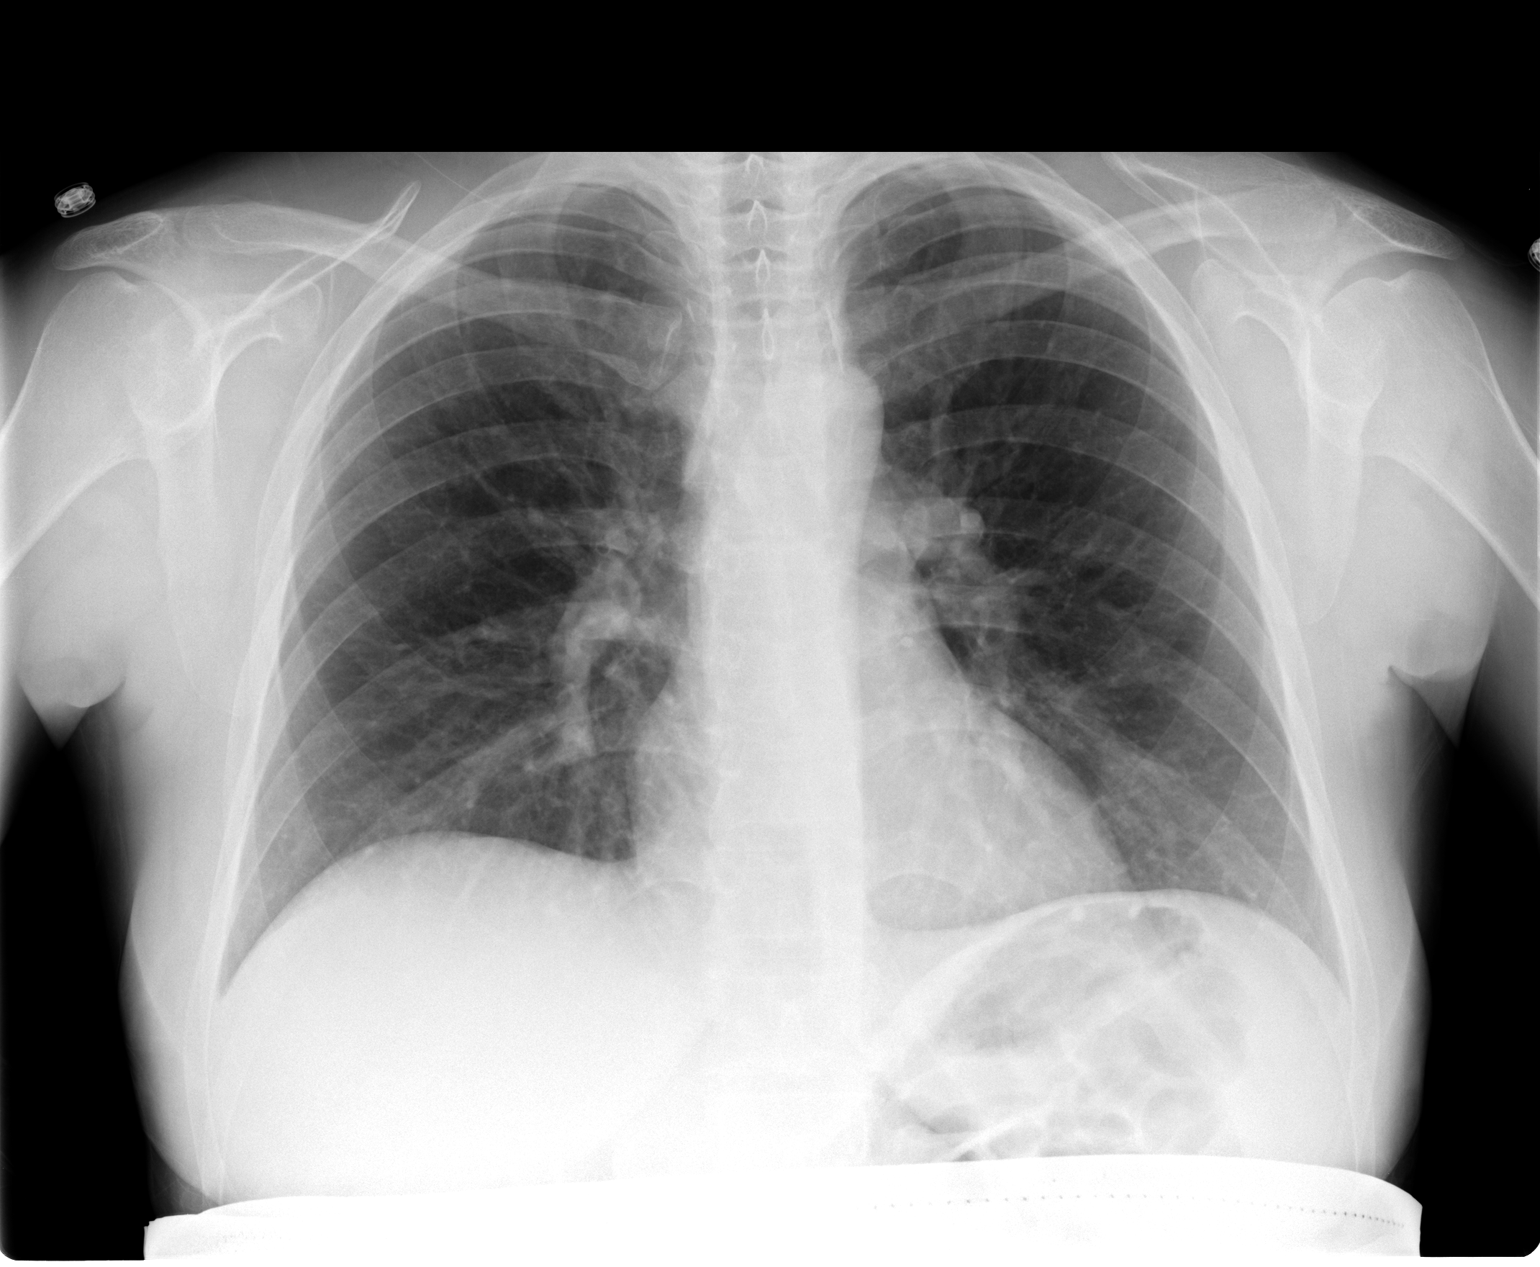

[view not recorded (2 of 2)]
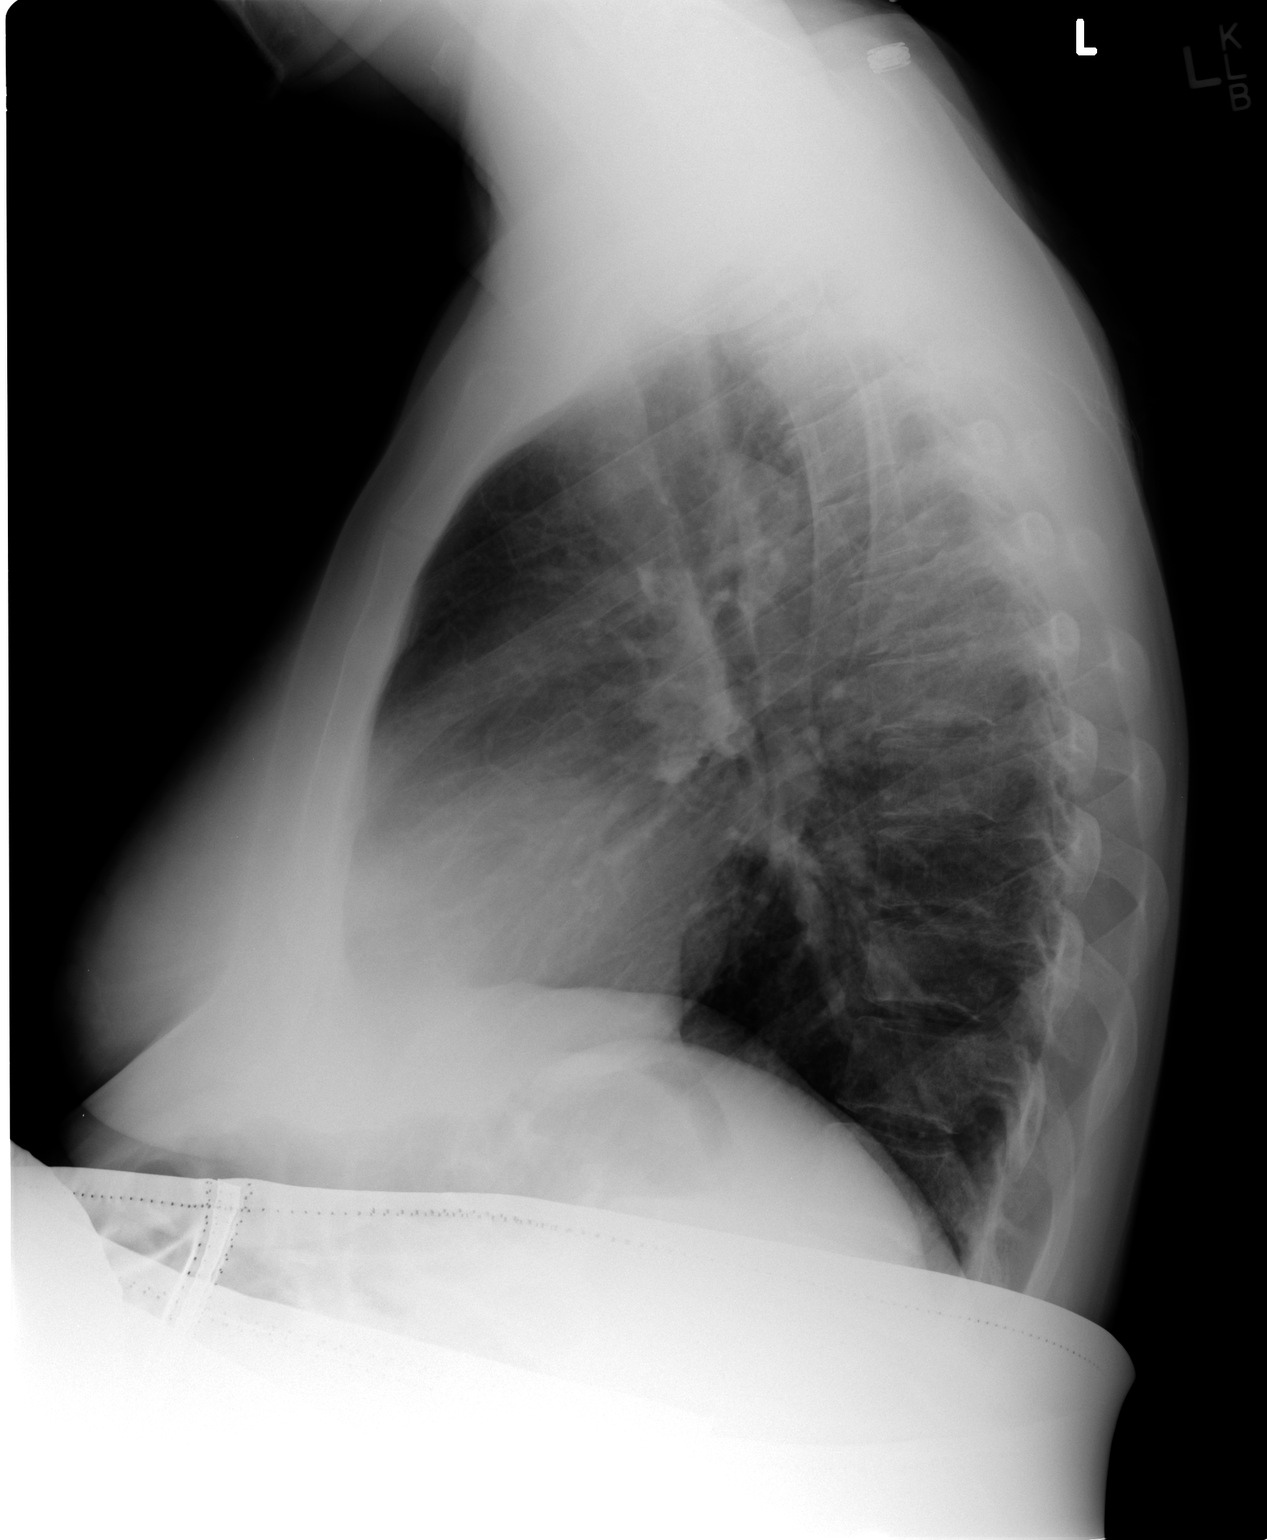

[2 of 2 positions shown; findings below may reference images not displayed]

FINDINGS: The heart size is normal. The lungs are clear. The visualized soft
tissues and bony thorax are unremarkable.
IMPRESSION: Negative two view chest.

## 2014-06-09 ENCOUNTER — Other Ambulatory Visit: Payer: Self-pay | Admitting: Obstetrics and Gynecology

## 2014-06-10 LAB — CYTOLOGY - PAP

## 2014-07-01 ENCOUNTER — Ambulatory Visit (INDEPENDENT_AMBULATORY_CARE_PROVIDER_SITE_OTHER): Payer: Managed Care, Other (non HMO) | Admitting: Internal Medicine

## 2014-07-01 ENCOUNTER — Encounter: Payer: Self-pay | Admitting: Internal Medicine

## 2014-07-01 VITALS — BP 120/82 | HR 79 | Temp 97.9°F | Wt 137.0 lb

## 2014-07-01 DIAGNOSIS — F988 Other specified behavioral and emotional disorders with onset usually occurring in childhood and adolescence: Secondary | ICD-10-CM

## 2014-07-01 DIAGNOSIS — Z0000 Encounter for general adult medical examination without abnormal findings: Secondary | ICD-10-CM

## 2014-07-01 MED ORDER — LISDEXAMFETAMINE DIMESYLATE 40 MG PO CAPS: 40.0000 mg | ORAL_CAPSULE | ORAL | Status: DC

## 2014-07-01 NOTE — Progress Notes (Signed)
Pre visit review using our clinic review tool, if applicable. No additional management support is needed unless otherwise documented below in the visit note. 

## 2014-07-01 NOTE — Patient Instructions (Signed)
Please continue all other medications as before, and refills have been done if requested - the vyvanse  Please have the pharmacy call with any other refills you may need.  Please continue your efforts at being more active, low cholesterol diet, and weight control.  You are otherwise up to date with prevention measures today.  Please keep your appointments with your specialists as you may have planned  No further lab work is needed today  Please return in 6 months, or sooner if needed

## 2014-07-01 NOTE — Progress Notes (Signed)
Subjective:    Patient ID: Sydney Stewart, female    DOB: [birth year], 33 y.o.   MRN: [id number]  HPI  Here for wellness and f/u;  Overall doing ok;  Pt denies CP, worsening SOB, DOE, wheezing, orthopnea, PND, worsening LE edema, palpitations, dizziness or syncope.  Pt denies neurological change such as new headache, facial or extremity weakness.  Pt denies polydipsia, polyuria, or low sugar symptoms. Pt states overall good compliance with treatment and medications, good tolerability, and has been trying to follow lower cholesterol diet.  Pt denies worsening depressive symptoms, suicidal ideation or panic. No fever, night sweats, wt loss, loss of appetite, or other constitutional symptoms.  Pt states good ability with ADL's, has low fall risk, home safety reviewed and adequate, no other significant changes in hearing or vision, and somewhat active with exercise. Has new 40 mo old infant s/p c-section. S/p IUD. 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  Needs re-start ADD med as did well in past with adequate social and work function and tolerated well. Past Medical History  Diagnosis Date  . 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 Medical history non-contributory    Past Surgical History  Procedure Laterality Date  . S/p iud    . Cesarean section  05/08/2011    Procedure: CESAREAN SECTION;  Surgeon: Zelphia Cairo;  Location: [facility];  Service: Gynecology;;  . Cesarean section  05/08/2011    Procedure: CESAREAN SECTION;  Surgeon: Zelphia Cairo;  Location: [facility];  Service: Gynecology;  Laterality: N/A;  Primary cesarean section with delivery of baby boy at 2055. Apgars 9/9. Cord blood x2 sent to nursery. Uterus massaged by [employer]  . Cesarean section N/A 03/18/2014    Procedure: CESAREAN SECTION;  Surgeon: Zelphia Cairo, MD;  Location: [facility];  Service: Obstetrics;   Laterality: N/A;  REPEAT EDC 5/27    reports that she has never smoked. She has never used smokeless tobacco. She reports that she does not drink alcohol or use illicit drugs. family history includes Cancer in her other. No Known Allergies No current outpatient prescriptions on file prior to visit.   No current facility-administered medications on file prior to visit.       Review of Systems Constitutional: Negative for increased diaphoresis, other activity, appetite or other siginficant weight change  HENT: Negative for worsening hearing loss, ear pain, facial swelling, mouth sores and neck stiffness.   Eyes: Negative for other worsening pain, redness or visual disturbance.  Respiratory: Negative for shortness of breath and wheezing.   Cardiovascular: Negative for chest pain and palpitations.  Gastrointestinal: Negative for diarrhea, blood in stool, abdominal distention or other pain Genitourinary: Negative for hematuria, flank pain or change in urine volume.  Musculoskeletal: Negative for myalgias or other joint complaints.  Skin: Negative for color change and wound.  Neurological: Negative for syncope and numbness. other than noted Hematological: Negative for adenopathy. or other swelling Psychiatric/Behavioral: Negative for hallucinations, self-injury, decreased concentration or other worsening agitation.      Objective:   Physical Exam BP 120/82  Pulse 79  Temp(Src) 97.9 F (36.6 C) (Oral)  Wt 137 lb (62.143 kg)  SpO2 98% VS noted,  Constitutional: Pt is oriented to person, place, and time. Appears well-developed and well-nourished.  Head: Normocephalic and atraumatic.  Right Ear: External ear normal.  Left Ear:  External ear normal.  Nose: Nose normal.  Mouth/Throat: Oropharynx is clear and moist.  Eyes: Conjunctivae and EOM are normal. Pupils are equal, round, and reactive to light.  Neck: Normal range of motion. Neck supple. No JVD present. No tracheal deviation  present.  Cardiovascular: Normal rate, regular rhythm, normal heart sounds and intact distal pulses.   Pulmonary/Chest: Effort normal and breath sounds without rales or wheezing  Abdominal: Soft. Bowel sounds are normal. NT. No HSM  Musculoskeletal: Normal range of motion. Exhibits no edema.  Lymphadenopathy:  Has no cervical adenopathy.  Neurological: Pt is alert and oriented to person, place, and time. Pt has normal reflexes. No cranial nerve deficit. Motor grossly intact Skin: Skin is warm and dry. No rash noted.  Psychiatric:  Has mild nervous mood and affect. Behavior is normal.     Assessment & Plan:

## 2014-07-04 NOTE — Assessment & Plan Note (Signed)
For re-start vyvanse,  to f/u any worsening symptoms or concerns

## 2014-07-04 NOTE — Assessment & Plan Note (Signed)

## 2014-07-26 ENCOUNTER — Encounter: Payer: Self-pay | Admitting: Internal Medicine

## 2014-08-13 ENCOUNTER — Other Ambulatory Visit: Payer: Self-pay

## 2014-08-30 ENCOUNTER — Encounter: Payer: Self-pay | Admitting: Internal Medicine

## 2014-10-12 ENCOUNTER — Telehealth: Payer: Self-pay | Admitting: Internal Medicine

## 2014-10-12 MED ORDER — LISDEXAMFETAMINE DIMESYLATE 40 MG PO CAPS: 40.0000 mg | ORAL_CAPSULE | ORAL | Status: DC

## 2014-10-12 NOTE — Telephone Encounter (Signed)
Called the patient informed to pickup hardcopy's at the front desk. 

## 2014-10-12 NOTE — Telephone Encounter (Signed)
Patient would like to pick up scripts for vyvanse

## 2014-10-12 NOTE — Telephone Encounter (Signed)
Done hardcopy to robin - total 3 mo 

## 2014-12-02 ENCOUNTER — Other Ambulatory Visit: Payer: Self-pay | Admitting: Dermatology

## 2014-12-14 ENCOUNTER — Other Ambulatory Visit: Payer: Self-pay | Admitting: Dermatology

## 2015-02-09 ENCOUNTER — Encounter: Payer: Self-pay | Admitting: Internal Medicine

## 2015-02-09 ENCOUNTER — Ambulatory Visit (INDEPENDENT_AMBULATORY_CARE_PROVIDER_SITE_OTHER): Payer: Managed Care, Other (non HMO) | Admitting: Internal Medicine

## 2015-02-09 VITALS — BP 118/76 | HR 79 | Temp 98.5°F | Resp 18

## 2015-02-09 DIAGNOSIS — H60399 Other infective otitis externa, unspecified ear: Secondary | ICD-10-CM | POA: Insufficient documentation

## 2015-02-09 DIAGNOSIS — H6692 Otitis media, unspecified, left ear: Secondary | ICD-10-CM

## 2015-02-09 DIAGNOSIS — H60392 Other infective otitis externa, left ear: Secondary | ICD-10-CM

## 2015-02-09 MED ORDER — AZITHROMYCIN 250 MG PO TABS: ORAL_TABLET | ORAL | Status: AC

## 2015-02-09 MED ORDER — LISDEXAMFETAMINE DIMESYLATE 40 MG PO CAPS: 40.0000 mg | ORAL_CAPSULE | ORAL | Status: DC

## 2015-02-09 MED ORDER — NEOMYCIN-POLYMYXIN-HC 1 % OT SOLN: 3.0000 [drp] | Freq: Four times a day (QID) | OTIC | Status: AC

## 2015-02-09 NOTE — Progress Notes (Signed)
   Subjective:    Patient ID: Sydney Stewart, female    DOB: 1981/10/14, 34 y.o.   MRN: [id number]  HPI   Here with 2-3 days acute onset feverish feeling, left ear pain with tenderness but no d/c, no facial pain, pressure, headache, but + general weakness and malaise,  with mild ST , no cough.  but pt denies chest pain, wheezing, increased sob or doe, orthopnea, PND, increased LE swelling, palpitations, dizziness or syncope.  Has 2 small children, both on antibx for ear infectoins Past Medical History  Diagnosis Date  . 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 Medical history non-contributory    Past Surgical History  Procedure Laterality Date  . S/p iud    . Cesarean section  05/08/2011    Procedure: CESAREAN SECTION;  Surgeon: Zelphia CairoGretchen Adkins;  Location: [facility];  Service: Gynecology;;  . Cesarean section  05/08/2011    Procedure: CESAREAN SECTION;  Surgeon: Zelphia CairoGretchen Adkins;  Location: [facility];  Service: Gynecology;  Laterality: N/A;  Primary cesarean section with delivery of baby boy at 2055. Apgars 9/9. Cord blood x2 sent to nursery. Uterus massaged by [employer]  . Cesarean section N/A 03/18/2014    Procedure: CESAREAN SECTION;  Surgeon: Zelphia CairoGretchen Adkins, MD;  Location: [facility];  Service: Obstetrics;  Laterality: N/A;  REPEAT EDC 5/27    reports that she has never smoked. She has never used smokeless tobacco. She reports that she does not drink alcohol or use illicit drugs. family history includes Cancer in her other. No Known Allergies No current outpatient prescriptions on file prior to visit.   No current facility-administered medications on file prior to visit.   Review of Systems All otherwise neg per pt     Objective:   Physical Exam BP 118/76 mmHg  Pulse 79  Temp(Src) 98.5 F (36.9 C) (Oral)  Resp 18  SpO2 96% VS noted,  Constitutional: Pt appears in no significant distress HENT: Head: NCAT.  Right Ear: External ear normal.  Left  Ear: External ear normal.  Eyes: . Pupils are equal, round, and reactive to light. Conjunctivae and EOM are normal Left tm's with severe erythema.and canal with mild eyrthema/tender/trace swelling but no pus or other d/c,  Max sinus areas non tender.  Pharynx with mild erythema, no exudate Neck: Normal range of motion. Neck supple.  Cardiovascular: Normal rate and regular rhythm.   Pulmonary/Chest: Effort normal and breath sounds without rales or wheezing.  Neurological: Pt is alert. Not confused , motor grossly intact Skin: Skin is warm. No rash, no LE edema Psychiatric: Pt behavior is normal. No agitation.      Assessment & Plan:

## 2015-02-09 NOTE — Assessment & Plan Note (Signed)
Mild to mod, for antibx course,  to f/u any worsening symptoms or concerns 

## 2015-02-09 NOTE — Progress Notes (Signed)
Pre visit review using our clinic review tool, if applicable. No additional management support is needed unless otherwise documented below in the visit note. 

## 2015-02-09 NOTE — Patient Instructions (Signed)
Please take all new medication as prescribed - the drops and the pill antibiotics  Please continue all other medications as before, and refills have been done if requested - the Vyvanse  Please have the pharmacy call with any other refills you may need.  Please keep your appointments with your specialists as you may have planned

## 2015-04-11 ENCOUNTER — Telehealth: Payer: Self-pay | Admitting: Internal Medicine

## 2015-04-11 NOTE — Telephone Encounter (Signed)
Patient is requesting refill script for vyvanse.

## 2015-04-12 MED ORDER — LISDEXAMFETAMINE DIMESYLATE 40 MG PO CAPS: 40.0000 mg | ORAL_CAPSULE | ORAL | Status: DC

## 2015-04-12 NOTE — Telephone Encounter (Signed)
I am not comfortable with this as her last rx was dated for April 10, 2015, so that would make a new rx too soon

## 2015-04-12 NOTE — Telephone Encounter (Signed)
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

## 2015-04-12 NOTE — Telephone Encounter (Signed)
Patient states she is going to [place] in July.  She would like to pick up in advance.

## 2015-04-13 NOTE — Telephone Encounter (Signed)
Rx in cabinet for pt pick up, pt informed 

## 2015-06-06 ENCOUNTER — Telehealth: Payer: Self-pay | Admitting: *Deleted

## 2015-06-06 NOTE — Telephone Encounter (Signed)
Pt requesting refill on her vyvance. Inform pt md out of office today will be back on tomorrow. Will give call back once md approve...Raechel Chute

## 2015-06-07 MED ORDER — LISDEXAMFETAMINE DIMESYLATE 40 MG PO CAPS: 40.0000 mg | ORAL_CAPSULE | ORAL | Status: DC

## 2015-06-07 NOTE — Telephone Encounter (Signed)
Done hardcopy to Dahlia  

## 2015-06-07 NOTE — Telephone Encounter (Signed)
Pt informed, Rx in cabinet for pt pick up  

## 2015-07-05 ENCOUNTER — Telehealth: Payer: Self-pay | Admitting: *Deleted

## 2015-07-05 NOTE — Telephone Encounter (Signed)
Left msg on triage stating need to pick up refill on her vyvance. MD normally give 3 months at a time...Sydney Stewart

## 2015-07-06 MED ORDER — LISDEXAMFETAMINE DIMESYLATE 40 MG PO CAPS: 40.0000 mg | ORAL_CAPSULE | ORAL | Status: DC

## 2015-07-06 NOTE — Telephone Encounter (Signed)
Pls advise on msg below.../lmb 

## 2015-07-06 NOTE — Telephone Encounter (Signed)
Done hardcopy to Dahlia  

## 2015-07-06 NOTE — Telephone Encounter (Signed)
Notified pt rx's ready for pick-up. Place in cabinet.../lmb 

## 2015-10-13 ENCOUNTER — Telehealth: Payer: Self-pay | Admitting: *Deleted

## 2015-10-13 NOTE — Telephone Encounter (Signed)
Is this ok to refill in Dr. Jonny RuizJohn absence Raechel Chute/lmb

## 2015-10-13 NOTE — Telephone Encounter (Signed)
Left msg on triage requesting refill on her Vyvance. MD out of office pls advise...Raechel Chute/lmb

## 2015-10-13 NOTE — Telephone Encounter (Signed)
She has not been seen recently enough for a schedule 2 narcotic prescription refill

## 2015-10-14 NOTE — Telephone Encounter (Signed)
LVM for pt to call back as soon as possible.    RE: md response below.  

## 2015-10-19 MED ORDER — LISDEXAMFETAMINE DIMESYLATE 40 MG PO CAPS: 40.0000 mg | ORAL_CAPSULE | ORAL | Status: AC

## 2015-10-19 NOTE — Telephone Encounter (Signed)
Done hardcopy to Dahlia  

## 2015-10-19 NOTE — Addendum Note (Signed)
Addended by: Corwin LevinsJOHN, Cindee Mclester W on: 10/19/2015 07:11 PM   Modules accepted: Orders

## 2015-10-19 NOTE — Telephone Encounter (Signed)
Pt called back. [place] and I have scheduled an appt for 1/4 for her and she is wondering if she can get a months prescription

## 2015-10-20 NOTE — Telephone Encounter (Signed)
Called pt no answer LMOM rx ready for pick-up.../lmb 

## 2015-11-01 ENCOUNTER — Telehealth: Payer: Self-pay | Admitting: Internal Medicine

## 2015-11-01 NOTE — Telephone Encounter (Signed)
Patient called to advise that her mother, lou green, will be picking up the vyvanse today

## 2015-11-02 ENCOUNTER — Ambulatory Visit: Payer: Managed Care, Other (non HMO) | Admitting: Internal Medicine
